# Patient Record
Sex: Male | Born: 1955
Health system: Southern US, Community
[De-identification: ages and names within clinical notes are randomized; demographics above are authoritative.]

## PROBLEM LIST (undated history)

## (undated) DIAGNOSIS — M25559 Pain in unspecified hip: Secondary | ICD-10-CM

## (undated) DIAGNOSIS — Z87442 Personal history of urinary calculi: Secondary | ICD-10-CM

## (undated) DIAGNOSIS — E785 Hyperlipidemia, unspecified: Secondary | ICD-10-CM

## (undated) DIAGNOSIS — J449 Chronic obstructive pulmonary disease, unspecified: Secondary | ICD-10-CM

## (undated) DIAGNOSIS — I10 Essential (primary) hypertension: Secondary | ICD-10-CM

## (undated) DIAGNOSIS — M1990 Unspecified osteoarthritis, unspecified site: Secondary | ICD-10-CM

## (undated) HISTORY — DX: Pain in unspecified hip: M25.559

## (undated) HISTORY — PX: CYSTOSCOPY: SUR368

## (undated) HISTORY — PX: LITHOTRIPSY: SUR834

## (undated) HISTORY — DX: Essential (primary) hypertension: I10

## (undated) HISTORY — DX: Hyperlipidemia, unspecified: E78.5

---
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
## 1999-03-25 ENCOUNTER — Emergency Department (HOSPITAL_COMMUNITY): Admission: EM | Admit: 1999-03-25 | Discharge: 1999-03-25 | Payer: Self-pay | Admitting: Emergency Medicine

## 1999-03-25 ENCOUNTER — Encounter: Payer: Self-pay | Admitting: Emergency Medicine

## 1999-03-29 ENCOUNTER — Ambulatory Visit (HOSPITAL_COMMUNITY): Admission: RE | Admit: 1999-03-29 | Discharge: 1999-03-29 | Payer: Self-pay | Admitting: Urology

## 1999-03-30 ENCOUNTER — Encounter: Payer: Self-pay | Admitting: Urology

## 1999-04-24 ENCOUNTER — Encounter: Payer: Self-pay | Admitting: Urology

## 1999-04-24 ENCOUNTER — Encounter: Admission: RE | Admit: 1999-04-24 | Discharge: 1999-04-24 | Payer: Self-pay | Admitting: Urology

## 1999-07-10 ENCOUNTER — Encounter: Payer: Self-pay | Admitting: Emergency Medicine

## 1999-07-10 ENCOUNTER — Emergency Department (HOSPITAL_COMMUNITY): Admission: EM | Admit: 1999-07-10 | Discharge: 1999-07-10 | Payer: Self-pay | Admitting: Emergency Medicine

## 1999-09-12 ENCOUNTER — Encounter: Admission: RE | Admit: 1999-09-12 | Discharge: 1999-09-12 | Payer: Self-pay | Admitting: Urology

## 1999-09-12 ENCOUNTER — Encounter: Payer: Self-pay | Admitting: Urology

## 1999-09-14 ENCOUNTER — Encounter: Payer: Self-pay | Admitting: Urology

## 1999-09-14 ENCOUNTER — Encounter: Admission: RE | Admit: 1999-09-14 | Discharge: 1999-09-14 | Payer: Self-pay | Admitting: Urology

## 1999-10-08 ENCOUNTER — Encounter: Admission: RE | Admit: 1999-10-08 | Discharge: 1999-10-08 | Payer: Self-pay | Admitting: Urology

## 1999-10-08 ENCOUNTER — Encounter: Payer: Self-pay | Admitting: Urology

## 1999-10-22 ENCOUNTER — Encounter: Payer: Self-pay | Admitting: Urology

## 1999-10-26 ENCOUNTER — Ambulatory Visit (HOSPITAL_COMMUNITY): Admission: RE | Admit: 1999-10-26 | Discharge: 1999-10-26 | Payer: Self-pay | Admitting: Urology

## 2002-08-23 ENCOUNTER — Encounter: Payer: Self-pay | Admitting: Urology

## 2002-08-23 ENCOUNTER — Ambulatory Visit (HOSPITAL_BASED_OUTPATIENT_CLINIC_OR_DEPARTMENT_OTHER): Admission: RE | Admit: 2002-08-23 | Discharge: 2002-08-23 | Payer: Self-pay | Admitting: Urology

## 2004-12-24 ENCOUNTER — Ambulatory Visit (HOSPITAL_COMMUNITY): Admission: RE | Admit: 2004-12-24 | Discharge: 2004-12-24 | Payer: Self-pay | Admitting: Urology

## 2017-03-18 DIAGNOSIS — C801 Malignant (primary) neoplasm, unspecified: Secondary | ICD-10-CM

## 2017-03-18 HISTORY — DX: Malignant (primary) neoplasm, unspecified: C80.1

## 2017-03-18 HISTORY — PX: HEMORRHOID BANDING: SHX5850

## 2018-03-18 DIAGNOSIS — J449 Chronic obstructive pulmonary disease, unspecified: Secondary | ICD-10-CM

## 2018-03-18 HISTORY — PX: LYMPHADENECTOMY: SHX5960

## 2018-03-18 HISTORY — DX: Chronic obstructive pulmonary disease, unspecified: J44.9

## 2018-11-03 ENCOUNTER — Other Ambulatory Visit: Payer: Self-pay | Admitting: Internal Medicine

## 2018-11-03 DIAGNOSIS — Z720 Tobacco use: Secondary | ICD-10-CM

## 2018-11-10 ENCOUNTER — Other Ambulatory Visit: Payer: Self-pay

## 2018-11-10 ENCOUNTER — Ambulatory Visit
Admission: RE | Admit: 2018-11-10 | Discharge: 2018-11-10 | Disposition: A | Discharge status: Home / Self Care | Payer: Medicare Other | Source: Ambulatory Visit | Attending: Internal Medicine | Admitting: Internal Medicine

## 2018-11-10 ENCOUNTER — Inpatient Hospital Stay: Admission: RE | Admit: 2018-11-10 | Payer: Self-pay | Source: Ambulatory Visit

## 2018-11-10 DIAGNOSIS — Z720 Tobacco use: Secondary | ICD-10-CM

## 2018-12-13 NOTE — Progress Notes (Signed)
Patient referred by Crist Infante, MD for coronary and aortic atherosclerosis  Subjective:   Nathan Porter, male    DOB: 19-Sep-1955, 63 y.o.   MRN: [id number]    Chief Complaint  Patient presents with  . Coronary Artery Disease  . New Patient (Initial Visit)     HPI  63 y.o. Caucasian male with hypertension, hyperlipidemia, tobacco abuse, COPD, referred for management of aortic and coronary atherosclerosis-noted on lung cancer screening CT chest.   Patient is an [place].  His physical activity is limited due to his pain.  However, he is able to mow his lawn of about 0.25 acres using push mower every day without any chest pain or shortness of breath.  He was recently diagnosed to have COPD with only symptoms being cough.  He started on Chantix and is trying to quit smoking.  Blood pressure is elevated at baseline.  He is currently not on any antihypertensive agents.  He was recently started on rosuvastatin 10 mg daily by Dr. Joylene Draft.  Past Medical History:  Diagnosis Date  . COPD (chronic obstructive pulmonary disease) ([place])   . Hip discomfort   . Hyperlipidemia   . Hypertension      Past Surgical History:  Procedure Laterality Date  . LYMPHADENECTOMY  03/2018     Social History   Socioeconomic History  . Marital status: Single    Spouse name: Not on file  . Number of children: Not on file  . Years of education: Not on file  . Highest education level: Not on file  Occupational History  . Not on file  Social Needs  . Financial resource strain: Not on file  . Food insecurity    Worry: Not on file    Inability: Not on file  . Transportation needs    Medical: Not on file    Non-medical: Not on file  Tobacco Use  . Smoking status: Not on file  Substance and Sexual Activity  . Alcohol use: Not on file  . Drug use: Not on file  . Sexual activity: Not on file  Lifestyle  . Physical activity    Days per week: Not on file    Minutes per session: Not  on file  . Stress: Not on file  Relationships  . Social Herbalist on phone: Not on file    Gets together: Not on file    Attends religious service: Not on file    Active member of club or organization: Not on file    Attends meetings of clubs or organizations: Not on file    Relationship status: Not on file  . Intimate partner violence    Fear of current or ex partner: Not on file    Emotionally abused: Not on file    Physically abused: Not on file    Forced sexual activity: Not on file  Other Topics Concern  . Not on file  Social History Narrative  . Not on file     Family History  Problem Relation Age of Onset  . Lung disease Mother        Tumor  . CAD Father        S/pCABG     Current Outpatient Medications on File Prior to Visit  Medication Sig Dispense Refill  . acyclovir (ZOVIRAX) 400 MG tablet Take 1 tablet by mouth as needed.    . CHANTIX STARTING MONTH PAK 0.5 MG X 11 & 1 MG X  42 tablet Take 2 tablets by mouth daily.    Marland Kitchen HYDROcodone-acetaminophen (NORCO/VICODIN) 5-325 MG tablet Take 1 tablet by mouth daily as needed.    . meloxicam (MOBIC) 15 MG tablet Take 1 tablet by mouth daily.    . promethazine (PHENERGAN) 25 MG tablet Take 1 tablet by mouth daily as needed.    . rosuvastatin (CRESTOR) 10 MG tablet Take 10 mg by mouth daily.    . tamsulosin (FLOMAX) 0.4 MG CAPS capsule Take 2 capsules by mouth at bedtime.    . traMADol (ULTRAM) 50 MG tablet Take 1 mg by mouth daily as needed.     No current facility-administered medications on file prior to visit.     Cardiovascular studies:  EKG 12/14/2018: Sinus rhythm 79 bpm.  Borderline left atrial enlargement. Early R wave transition. Nonspecific ST depression anteroseptal leads.  CT chest 11/10/2018: 1. Lung-RADS 2S, benign appearance or behavior. Continue annual screening with low-dose chest CT without contrast in 12 months. 2. The "S" modifier above refers to potentially clinically significant  non lung cancer related findings. Specifically, there is aortic atherosclerosis, in addition to left main and 3 vessel coronary artery disease. Please note that although the presence of coronary artery calcium documents the presence of coronary artery disease, the severity of this disease and any potential stenosis cannot be assessed on this non-gated CT examination. Assessment for potential risk factor modification, dietary therapy or pharmacologic therapy may be warranted, if clinically indicated. 3. Mild diffuse bronchial wall thickening with mild to moderate centrilobular and mild paraseptal emphysema; imaging findings suggestive of underlying COPD.  Aortic Atherosclerosis (ICD10-I70.0) and Emphysema (ICD10-J43.9).   Recent labs: 10/01/2018: Glucose 93. BUN/Cr 19/1.0. eGFR 75. Na/K 141/4.8 H/H 16/49. MCV 92. Plateelts 274. Chol 190, TG 99, HDL 39, LDL 131. LDL-P 1348 nmol/L (<1000) HDL-P 26.6 nmol/L (>30.5)    Review of Systems  Constitution: Negative for decreased appetite, malaise/fatigue, weight gain and weight loss.  HENT: Negative for congestion.   Eyes: Negative for visual disturbance.  Cardiovascular: Negative for chest pain, dyspnea on exertion, leg swelling, palpitations and syncope.  Respiratory: Negative for cough.   Endocrine: Negative for cold intolerance.  Hematologic/Lymphatic: Does not bruise/bleed easily.  Skin: Negative for itching and rash.  Musculoskeletal: Negative for myalgias.  Gastrointestinal: Negative for abdominal pain, nausea and vomiting.  Genitourinary: Negative for dysuria.  Neurological: Negative for dizziness and weakness.  Psychiatric/Behavioral: The patient is not nervous/anxious.   All other systems reviewed and are negative.        Vitals:   12/14/18 0859  BP: (!) 145/71  Pulse: 84  Temp: (!) 97.3 F (36.3 C)  SpO2: 95%     There is no height or weight on file to calculate BMI. Filed Weights   12/14/18 0859  Weight:  193 lb 1.6 oz (87.6 kg)     Objective:   Physical Exam  Constitutional: He is oriented to person, place, and time. He appears well-developed and well-nourished. No distress.  HENT:  Head: Normocephalic and atraumatic.  Eyes: Pupils are equal, round, and reactive to light. Conjunctivae are normal.  Neck: No JVD present.  Cardiovascular: Normal rate, regular rhythm and intact distal pulses.  No murmur heard. Pulmonary/Chest: Effort normal and breath sounds normal. He has no wheezes. He has no rales.  Abdominal: Soft. Bowel sounds are normal. There is no rebound.  Musculoskeletal:        General: No edema.  Lymphadenopathy:    He has no cervical adenopathy.  Neurological:  He is alert and oriented to person, place, and time. No cranial nerve deficit.  Skin: Skin is warm and dry.  Psychiatric: He has a normal mood and affect.  Nursing note and vitals reviewed.         Assessment & Recommendations:   63 y.o. Caucasian male with tobacco abuse, hyperlipidemia, referred for management of aortic and coronary atherosclerosis-noted on lung cancer screening CT chest.   Coronary and aortic atherosclerosis: Noted on lung cancer screening CT chest. Patient does not have any angina or angina equivalent symptoms at this time. Recommend aggressive risk factor modification. Recommend Aspirin 81 mg daily and high intensity statin. Continue rosuvastatin 40 mg for now. Will repeat lipid panel in 3 months. Started amlodipine 5 mg daily for blood hypertension.  Tobacco cessation counseling (CPT [phone]):  - Currently smoking 1 packs/day   - Patient was informed of the dangers of tobacco abuse including stroke, cancer, and MI, as well as benefits of tobacco cessation. - Patient is willing to quit at this time. - Approximately 5 mins were spent counseling patient cessation techniques. We discussed various methods to help quit smoking, including deciding on a date to quit, joining a support group,  pharmacological agents- nicotine gum/patch/lozenges, chantix. Patient is currently using Chantix. - I will reassess his progress at the next follow-up visit  AAA screening: >65 yr smoker. Will obtain US.   Hypertension: As above.  Recommend discussing flu, Pneumovax, and herpes Zoster vaccine with the patient.    Thank you for referring the patient to [place]. Please feel free to contact with any questions.  Nigel Mormon, MD [facility] Pager: [phone] Office: [phone] If no answer Cell [phone]

## 2018-12-14 ENCOUNTER — Ambulatory Visit (INDEPENDENT_AMBULATORY_CARE_PROVIDER_SITE_OTHER): Payer: Medicare Other | Admitting: Cardiology

## 2018-12-14 ENCOUNTER — Encounter: Payer: Self-pay | Admitting: Cardiology

## 2018-12-14 VITALS — BP 145/71 | HR 84 | Temp 97.3°F | Ht 68.0 in | Wt 193.1 lb

## 2018-12-14 DIAGNOSIS — I700 Atherosclerosis of aorta: Secondary | ICD-10-CM

## 2018-12-14 DIAGNOSIS — I10 Essential (primary) hypertension: Secondary | ICD-10-CM | POA: Diagnosis not present

## 2018-12-14 DIAGNOSIS — I2510 Atherosclerotic heart disease of native coronary artery without angina pectoris: Secondary | ICD-10-CM | POA: Insufficient documentation

## 2018-12-14 DIAGNOSIS — F17210 Nicotine dependence, cigarettes, uncomplicated: Secondary | ICD-10-CM | POA: Diagnosis not present

## 2018-12-14 DIAGNOSIS — E782 Mixed hyperlipidemia: Secondary | ICD-10-CM | POA: Diagnosis not present

## 2018-12-14 DIAGNOSIS — Z720 Tobacco use: Secondary | ICD-10-CM

## 2018-12-14 MED ORDER — AMLODIPINE BESYLATE 5 MG PO TABS: 5.0000 mg | ORAL_TABLET | Freq: Every day | ORAL | 3 refills | Status: DC

## 2018-12-14 MED ORDER — ASPIRIN EC 81 MG PO TBEC: 81.0000 mg | DELAYED_RELEASE_TABLET | Freq: Every day | ORAL | 3 refills | Status: DC

## 2019-01-08 ENCOUNTER — Other Ambulatory Visit: Payer: Self-pay

## 2019-01-08 ENCOUNTER — Ambulatory Visit (INDEPENDENT_AMBULATORY_CARE_PROVIDER_SITE_OTHER): Payer: Medicare Other

## 2019-01-08 DIAGNOSIS — Z720 Tobacco use: Secondary | ICD-10-CM | POA: Diagnosis not present

## 2019-01-08 DIAGNOSIS — I771 Stricture of artery: Secondary | ICD-10-CM

## 2019-01-08 DIAGNOSIS — I700 Atherosclerosis of aorta: Secondary | ICD-10-CM

## 2019-01-11 NOTE — Progress Notes (Signed)
Pt aware.

## 2019-03-15 ENCOUNTER — Ambulatory Visit: Payer: 59 | Admitting: Cardiology

## 2019-03-15 ENCOUNTER — Ambulatory Visit (INDEPENDENT_AMBULATORY_CARE_PROVIDER_SITE_OTHER): Payer: Medicare Other | Admitting: Cardiology

## 2019-03-15 ENCOUNTER — Other Ambulatory Visit: Payer: Self-pay

## 2019-03-15 ENCOUNTER — Encounter: Payer: Self-pay | Admitting: Cardiology

## 2019-03-15 VITALS — BP 133/71 | HR 76 | Ht 69.0 in | Wt 200.0 lb

## 2019-03-15 DIAGNOSIS — I10 Essential (primary) hypertension: Secondary | ICD-10-CM | POA: Diagnosis not present

## 2019-03-15 DIAGNOSIS — I2510 Atherosclerotic heart disease of native coronary artery without angina pectoris: Secondary | ICD-10-CM

## 2019-03-15 DIAGNOSIS — E782 Mixed hyperlipidemia: Secondary | ICD-10-CM | POA: Diagnosis not present

## 2019-03-15 DIAGNOSIS — I700 Atherosclerosis of aorta: Secondary | ICD-10-CM | POA: Diagnosis not present

## 2019-03-15 NOTE — Progress Notes (Signed)
Patient referred by Crist Infante, MD for coronary and aortic atherosclerosis  Subjective:   Nathan Porter, male    DOB: [birth year], 63 y.o.   MRN: [id number]    Chief Complaint  Patient presents with  . Aortic atherosclerosis     HPI  63 y.o. Caucasian male with hypertension, hyperlipidemia, aortic and coronary atherosclerosis without angina.  He is currently not taking Aspirin. His primary complaint is hip pain, for which he is taking meloxicam. Blood pressure is well controlled on amlodipine. He has not checked his lipid panel.   Initial consult HPI 11/2018: Patient is an [place].  His physical activity is limited due to his pain.  However, he is able to mow his lawn of about 0.25 acres using push mower every day without any chest pain or shortness of breath.  He was recently diagnosed to have COPD with only symptoms being cough.  He started on Chantix and is trying to quit smoking.  Blood pressure is elevated at baseline.  He is currently not on any antihypertensive agents.  He was recently started on rosuvastatin 10 mg daily by Dr. Joylene Draft.  Past Medical History:  Diagnosis Date  . COPD (chronic obstructive pulmonary disease) ([place])   . Hip discomfort   . Hyperlipidemia   . Hypertension      Past Surgical History:  Procedure Laterality Date  . LYMPHADENECTOMY  03/2018     Social History   Socioeconomic History  . Marital status: Married    Spouse name: Not on file  . Number of children: 2  . Years of education: Not on file  . Highest education level: Not on file  Occupational History  . Not on file  Tobacco Use  . Smoking status: Current Every Day Smoker    Packs/day: 1.00  . Smokeless tobacco: Never Used  Substance and Sexual Activity  . Alcohol use: Not Currently  . Drug use: Not on file  . Sexual activity: Not on file  Other Topics Concern  . Not on file  Social History Narrative  . Not on file   Social Determinants of Health    Financial Resource Strain:   . Difficulty of Paying Living Expenses: Not on file  Food Insecurity:   . Worried About Charity fundraiser in the Last Year: Not on file  . Ran Out of Food in the Last Year: Not on file  Transportation Needs:   . Lack of Transportation (Medical): Not on file  . Lack of Transportation (Non-Medical): Not on file  Physical Activity:   . Days of Exercise per Week: Not on file  . Minutes of Exercise per Session: Not on file  Stress:   . Feeling of Stress : Not on file  Social Connections:   . Frequency of Communication with Friends and Family: Not on file  . Frequency of Social Gatherings with Friends and Family: Not on file  . Attends Religious Services: Not on file  . Active Member of Clubs or Organizations: Not on file  . Attends Archivist Meetings: Not on file  . Marital Status: Not on file  Intimate Partner Violence:   . Fear of Current or Ex-Partner: Not on file  . Emotionally Abused: Not on file  . Physically Abused: Not on file  . Sexually Abused: Not on file     Family History  Problem Relation Age of Onset  . Lung disease Mother        Tumor  .  CAD Father        S/pCABG     Current Outpatient Medications on File Prior to Visit  Medication Sig Dispense Refill  . acyclovir (ZOVIRAX) 400 MG tablet Take 1 tablet by mouth as needed.    Marland Kitchen amLODipine (NORVASC) 5 MG tablet Take 1 tablet (5 mg total) by mouth daily. 90 tablet 3  . meloxicam (MOBIC) 15 MG tablet Take 1 tablet by mouth daily.    . promethazine (PHENERGAN) 25 MG tablet Take 1 tablet by mouth daily as needed.    . rosuvastatin (CRESTOR) 10 MG tablet Take 10 mg by mouth daily.    . tamsulosin (FLOMAX) 0.4 MG CAPS capsule Take 2 capsules by mouth at bedtime.    . traMADol (ULTRAM) 50 MG tablet Take 1 mg by mouth daily as needed.     No current facility-administered medications on file prior to visit.    Cardiovascular studies:  Abdominal Aortic Duplex   01/08/2019: No AAA observed.  The maximum aorta (sac) diameter is 2.24 cm (mid). Diffuse plaque observed in the mid aorta.  Mild increase in left internal iliac artery velocity and suggests <50% stenosis, with normal triphasic wave pattern.   EKG 12/14/2018: Sinus rhythm 79 bpm.  Borderline left atrial enlargement. Early R wave transition. Nonspecific ST depression anteroseptal leads.  CT chest 11/10/2018: 1. Lung-RADS 2S, benign appearance or behavior. Continue annual screening with low-dose chest CT without contrast in 12 months. 2. The "S" modifier above refers to potentially clinically significant non lung cancer related findings. Specifically, there is aortic atherosclerosis, in addition to left main and 3 vessel coronary artery disease. Please note that although the presence of coronary artery calcium documents the presence of coronary artery disease, the severity of this disease and any potential stenosis cannot be assessed on this non-gated CT examination. Assessment for potential risk factor modification, dietary therapy or pharmacologic therapy may be warranted, if clinically indicated. 3. Mild diffuse bronchial wall thickening with mild to moderate centrilobular and mild paraseptal emphysema; imaging findings suggestive of underlying COPD.  Aortic Atherosclerosis (ICD10-I70.0) and Emphysema (ICD10-J43.9).   Recent labs: 10/01/2018: Glucose 93. BUN/Cr 19/1.0. eGFR 75. Na/K 141/4.8 H/H 16/49. MCV 92. Plateelts 274. Chol 190, TG 99, HDL 39, LDL 131. LDL-P 1348 nmol/L (<1000) HDL-P 26.6 nmol/L (>30.5)    Review of Systems  Constitution: Negative for decreased appetite, malaise/fatigue, weight gain and weight loss.  HENT: Negative for congestion.   Eyes: Negative for visual disturbance.  Cardiovascular: Negative for chest pain, dyspnea on exertion, leg swelling, palpitations and syncope.  Respiratory: Negative for cough.   Endocrine: Negative for cold intolerance.   Hematologic/Lymphatic: Does not bruise/bleed easily.  Skin: Negative for itching and rash.  Musculoskeletal: Negative for myalgias.  Gastrointestinal: Negative for abdominal pain, nausea and vomiting.  Genitourinary: Negative for dysuria.  Neurological: Negative for dizziness and weakness.  Psychiatric/Behavioral: The patient is not nervous/anxious.   All other systems reviewed and are negative.        Vitals:   03/15/19 1527  BP: 133/71  Pulse: 76  SpO2: 94%     Body mass index is 29.53 kg/m. Filed Weights   03/15/19 1527  Weight: 200 lb (90.7 kg)     Objective:   Physical Exam  Constitutional: He is oriented to person, place, and time. He appears well-developed and well-nourished. No distress.  HENT:  Head: Normocephalic and atraumatic.  Eyes: Pupils are equal, round, and reactive to light. Conjunctivae are normal.  Neck: No JVD present.  Cardiovascular: Normal rate, regular rhythm and intact distal pulses.  No murmur heard. Pulmonary/Chest: Effort normal and breath sounds normal. He has no wheezes. He has no rales.  Abdominal: Soft. Bowel sounds are normal. There is no rebound.  Musculoskeletal:        General: No edema.  Lymphadenopathy:    He has no cervical adenopathy.  Neurological: He is alert and oriented to person, place, and time. No cranial nerve deficit.  Skin: Skin is warm and dry.  Psychiatric: He has a normal mood and affect.  Nursing note and vitals reviewed.         Assessment & Recommendations:   63 y.o. Caucasian male with hypertension, hyperlipidemia, aortic and coronary atherosclerosis without angina.  Coronary and aortic atherosclerosis: Noted on lung cancer screening CT chest. Patient does not have any angina or angina equivalent symptoms at this time. Recommend aggressive risk factor modification. Continue rosuvastatin 40 mg daily. Lipid panel pending. Will obtain now. Given ongoing use of meloxicam, it is probably best to skip  Aspirin at this time, as risks outweigh benefits.   Hypertension: Controlled.   F/u in 6 months  Lamoyne Palencia Esther Hardy, MD [facility] [place] Pager: [phone] Office: [phone] If no answer Cell [phone]

## 2019-04-09 ENCOUNTER — Ambulatory Visit: Payer: Medicare Other | Attending: Internal Medicine

## 2019-04-09 DIAGNOSIS — Z23 Encounter for immunization: Secondary | ICD-10-CM

## 2019-04-09 NOTE — Progress Notes (Signed)
   Covid-19 Vaccination Clinic  Name:  Nathan Porter    MRN: [id number] DOB: July 07, 1955  04/09/2019  [name] was observed post Covid-19 immunization for 15 minutes without incidence. He was provided with Vaccine Information Sheet and instruction to access the V-Safe system.   [name] was instructed to call 911 with any severe reactions post vaccine: Marland Kitchen Difficulty breathing  . Swelling of your face and throat  . A fast heartbeat  . A bad rash all over your body  . Dizziness and weakness    Immunizations Administered    Name Date Dose VIS Date Route   Pfizer COVID-19 Vaccine 04/09/2019  8:20 AM 0.3 mL 02/26/2019 Intramuscular   Manufacturer: [place]   Lot: [id number]   [place]: [id number]

## 2019-04-29 ENCOUNTER — Ambulatory Visit: Payer: Medicare Other | Attending: Internal Medicine

## 2019-04-29 DIAGNOSIS — Z23 Encounter for immunization: Secondary | ICD-10-CM

## 2019-04-29 NOTE — Progress Notes (Signed)
   Covid-19 Vaccination Clinic  Name:  Nathan Porter    MRN: [id number] DOB: March 17, 1956  04/29/2019  Nathan Porter was observed post Covid-19 immunization for 15 minutes without incidence. He was provided with Vaccine Information Sheet and instruction to access the V-Safe system.   Nathan Porter was instructed to call 911 with any severe reactions post vaccine: Marland Kitchen Difficulty breathing  . Swelling of your face and throat  . A fast heartbeat  . A bad rash all over your body  . Dizziness and weakness    Immunizations Administered    Name Date Dose VIS Date Route   Pfizer COVID-19 Vaccine 04/29/2019  3:58 PM 0.3 mL 02/26/2019 Intramuscular   Manufacturer: [place]   Lot: [id number]   [place]: [id number]

## 2019-06-28 ENCOUNTER — Encounter: Payer: Self-pay | Admitting: Cardiology

## 2019-07-16 ENCOUNTER — Encounter: Payer: Self-pay | Admitting: Emergency Medicine

## 2019-07-16 ENCOUNTER — Other Ambulatory Visit: Payer: Self-pay

## 2019-07-16 ENCOUNTER — Ambulatory Visit (INDEPENDENT_AMBULATORY_CARE_PROVIDER_SITE_OTHER): Payer: Medicare Other | Admitting: Emergency Medicine

## 2019-07-16 DIAGNOSIS — J449 Chronic obstructive pulmonary disease, unspecified: Secondary | ICD-10-CM

## 2019-07-16 DIAGNOSIS — Z720 Tobacco use: Secondary | ICD-10-CM | POA: Diagnosis not present

## 2019-07-16 NOTE — Addendum Note (Signed)
Addended by: Vanessa Barbara on: 07/16/2019 02:25 PM   Modules accepted: Orders

## 2019-07-16 NOTE — Assessment & Plan Note (Signed)
Needs repeat low-dose CT scan of the chest in August 2021 for RADS 2 study last year.  Discussed cessation with him.  He is not ready to set a quit date even though he is currently maintained on Wellbutrin.  I have encouraged him to try to cut down to 10 cigarettes daily.  We will continue to try to wean until we get to the point where he feels comfortable trying to set a quit date.  At that time we will discuss strategies for successful cessation.

## 2019-07-16 NOTE — Assessment & Plan Note (Signed)
Emphysema seen on his CT scan of the chest and he does have exertional symptoms.  Severity not yet quantified by PFT but based on clinical status likely gold B disease.  No clear indication at this time for bronchodilators.  He does need pulmonary function testing.  He would like to defer until "Covid calms down".  I do not think there is an urgent need to check his PFT as long as we do it soon.  I do not think he needs this for me to determine his risk for upcoming hip surgery.  He is at moderate increased risk due to his COPD and active tobacco use.  I did talk to him about cessation.  Based on your CT scan of the chest and clinical symptoms, history of tobacco, agree that you do have COPD which places you at moderate risk for surgery or general anesthesia.  This does not mean that you cannot proceed with surgery.  We will confirm this information and recommendation for Dr. Alvester Morin office. We should perform pulmonary function testing when possible to quantify your degree of COPD and decide based on this and symptoms whether he might benefit from taking inhaled medications in the future.  We can discuss the timing of PFT when we follow-up The most important thing for you to do right now is to try to decrease her smoking.  Congratulations on cutting down.  We will set a goal of getting down to 10 cigarettes daily before your surgery and by our next office visit. Plan for repeat low-dose CT scan of the chest for lung cancer screening in August 2021 Follow with Dr Lamonte Sakai in 6 months or sooner if you have any problems

## 2019-07-16 NOTE — Patient Instructions (Signed)
Based on your CT scan of the chest and clinical symptoms, history of tobacco, agree that you do have COPD which places you at moderate risk for surgery or general anesthesia.  This does not mean that you cannot proceed with surgery.  We will confirm this information and recommendation for Dr. Alvester Morin office. We should perform pulmonary function testing when possible to quantify your degree of COPD and decide based on this and symptoms whether he might benefit from taking inhaled medications in the future.  We can discuss the timing of PFT when we follow-up The most important thing for you to do right now is to try to decrease her smoking.  Congratulations on cutting down.  We will set a goal of getting down to 10 cigarettes daily before your surgery and by our next office visit. Plan for repeat low-dose CT scan of the chest for lung cancer screening in August 2021 Follow with Dr Lamonte Sakai in 6 months or sooner if you have any problems

## 2019-07-16 NOTE — Progress Notes (Signed)
Subjective:    Patient ID: Nathan Porter, male    DOB: 08-29-1955, 64 y.o.   MRN: [id number]  HPI 64 year old smoker (45 pack years, smokes 1 pack daily), with hypertension, hyperlipidemia, CAD based on coronary calcification on LDCT performed for lung cancer screening 10/2018.  That was a RADS 2 study, reassuring.  Referred today for pulmonary evaluation and restratification in preparation for right total hip replacement by Dr. French Ana.   He was told that he had COPD about 5 months ago, based on emphysematous changes on LDCT 10/2018. 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 Activity limited some by his R hip pain, some deconditioning. He has a cough, bothers him at night, does not wake him. Has heard wheeze before, usually when he needs to cough. He is able walk indefinitely without dyspnea, some SOB stairs. He can play golf but was SOB with the hills. On no BD. He has tried chantix, ineffective. Now on wellbutrin and has cut down to 15 cig daily.    Seasonal allergies, uses nasacort prn  Review of Systems  Constitutional: Negative for activity change, appetite change, chills, diaphoresis, fatigue, fever and unexpected weight change.  HENT: Negative for congestion, dental problem, nosebleeds, postnasal drip, rhinorrhea, sinus pressure, sneezing, trouble swallowing and voice change.   Eyes: Negative for itching and visual disturbance.  Respiratory: Positive for cough and shortness of breath. Negative for choking, chest tightness, wheezing and stridor.   Cardiovascular: Negative for chest pain, palpitations and leg swelling.  Gastrointestinal: Negative for abdominal pain.  Musculoskeletal: Positive for gait problem and joint swelling. Negative for myalgias.  Skin: Negative for rash.  Neurological: Negative for syncope, light-headedness and headaches.  Psychiatric/Behavioral: Negative for sleep disturbance.    Past Medical History:  Diagnosis Date  . COPD (chronic  obstructive pulmonary disease) ([place])   . Hip discomfort   . Hyperlipidemia   . Hypertension      Family History  Problem Relation Age of Onset  . Lung disease Mother        Tumor  . CAD Father        S/pCABG     Social History   Socioeconomic History  . Marital status: Married    Spouse name: Not on file  . Number of children: 2  . Years of education: Not on file  . Highest education level: Not on file  Occupational History  . Not on file  Tobacco Use  . Smoking status: Current Every Day Smoker    Packs/day: 1.00    Years: 45.00    Pack years: 45.00  . Smokeless tobacco: Never Used  Substance and Sexual Activity  . Alcohol use: Not Currently  . Drug use: Not on file  . Sexual activity: Not on file  Other Topics Concern  . Not on file  Social History Narrative  . Not on file   Social Determinants of Health   Financial Resource Strain:   . Difficulty of Paying Living Expenses:   Food Insecurity:   . Worried About Charity fundraiser in the Last Year:   . Arboriculturist in the Last Year:   Transportation Needs:   . Film/video editor (Medical):   Marland Kitchen Lack of Transportation (Non-Medical):   Physical Activity:   . Days of Exercise per Week:   . Minutes of Exercise per Session:   Stress:   . Feeling of Stress :   Social Connections:   .  Frequency of Communication with Friends and Family:   . Frequency of Social Gatherings with Friends and Family:   . Attends Religious Services:   . Active Member of Clubs or Organizations:   . Attends Archivist Meetings:   Marland Kitchen Marital Status:   Intimate Partner Violence:   . Fear of Current or Ex-Partner:   . Emotionally Abused:   Marland Kitchen Physically Abused:   . Sexually Abused:      Not on File   Outpatient Medications Prior to Visit  Medication Sig Dispense Refill  . meloxicam (MOBIC) 15 MG tablet Take 1 tablet by mouth daily.    . methylcellulose oral powder Take 1 packet by mouth daily. OTC fibercon daily      . OVER THE COUNTER MEDICATION Place 1 puff into alternate nostrils. nasocort OTC    . rosuvastatin (CRESTOR) 10 MG tablet Take 10 mg by mouth daily.    . tamsulosin (FLOMAX) 0.4 MG CAPS capsule Take 2 capsules by mouth at bedtime.    Marland Kitchen amLODipine (NORVASC) 5 MG tablet Take 1 tablet (5 mg total) by mouth daily. 90 tablet 3  . promethazine (PHENERGAN) 25 MG tablet Take 1 tablet by mouth daily as needed.    . traMADol (ULTRAM) 50 MG tablet Take 1 mg by mouth daily as needed.    Marland Kitchen acyclovir (ZOVIRAX) 400 MG tablet Take 1 tablet by mouth as needed.     No facility-administered medications prior to visit.        Objective:   Physical Exam  Vitals:   07/16/19 1338  BP: 120/80  Pulse: 69  Temp: 97.9 F (36.6 C)  TempSrc: Temporal  SpO2: 95%  Weight: 199 lb 3.2 oz (90.4 kg)  Height: 5\' 8"  (1.727 m)   Gen: Pleasant, overwt man, in no distress,  normal affect  ENT: No lesions,  mouth clear,  oropharynx clear, no postnasal drip  Neck: No JVD, no stridor  Lungs: No use of accessory muscles, no crackles or wheezing on normal respiration, he does wheeze on forced expiration  Cardiovascular: RRR, heart sounds normal, no murmur or gallops, no peripheral edema  Musculoskeletal: No deformities, no cyanosis or clubbing  Neuro: alert, awake, non focal  Skin: Warm, no lesions or rash     Assessment & Plan:  COPD (chronic obstructive pulmonary disease) (HCC) Emphysema seen on his CT scan of the chest and he does have exertional symptoms.  Severity not yet quantified by PFT but based on clinical status likely gold B disease.  No clear indication at this time for bronchodilators.  He does need pulmonary function testing.  He would like to defer until "Covid calms down".  I do not think there is an urgent need to check his PFT as long as we do it soon.  I do not think he needs this for me to determine his risk for upcoming hip surgery.  He is at moderate increased risk due to his COPD and  active tobacco use.  I did talk to him about cessation.  Based on your CT scan of the chest and clinical symptoms, history of tobacco, agree that you do have COPD which places you at moderate risk for surgery or general anesthesia.  This does not mean that you cannot proceed with surgery.  We will confirm this information and recommendation for Dr. Alvester Morin office. We should perform pulmonary function testing when possible to quantify your degree of COPD and decide based on this and symptoms whether he might  benefit from taking inhaled medications in the future.  We can discuss the timing of PFT when we follow-up The most important thing for you to do right now is to try to decrease her smoking.  Congratulations on cutting down.  We will set a goal of getting down to 10 cigarettes daily before your surgery and by our next office visit. Plan for repeat low-dose CT scan of the chest for lung cancer screening in August 2021 Follow with Dr Lamonte Sakai in 6 months or sooner if you have any problems  Tobacco abuse Needs repeat low-dose CT scan of the chest in August 2021 for RADS 2 study last year.  Discussed cessation with him.  He is not ready to set a quit date even though he is currently maintained on Wellbutrin.  I have encouraged him to try to cut down to 10 cigarettes daily.  We will continue to try to wean until we get to the point where he feels comfortable trying to set a quit date.  At that time we will discuss strategies for successful cessation.  Baltazar Apo, MD, PhD 07/16/2019, 2:04 PM [facility] [phone] or if no answer [phone]

## 2019-07-29 ENCOUNTER — Ambulatory Visit: Payer: Self-pay | Admitting: Physician Assistant

## 2019-07-29 NOTE — H&P (View-Only) (Signed)
TOTAL HIP ADMISSION H&P  Patient is admitted for right total hip arthroplasty.  Subjective:  Chief Complaint: right hip pain  HPI: Nathan Porter, 64 y.o. male, has a history of pain and functional disability in the right hip(s) due to arthritis and patient has failed non-surgical conservative treatments for greater than 12 weeks to include NSAID's and/or analgesics, corticosteriod injections and activity modification.  Onset of symptoms was gradual starting 5 years ago with gradually worsening course since that time.The patient noted no past surgery on the right hip(s).  Patient currently rates pain in the right hip at 8 out of 10 with activity. Patient has night pain, worsening of pain with activity and weight bearing, trendelenberg gait and pain that interfers with activities of daily living. Patient has evidence of periarticular osteophytes and joint space narrowing by imaging studies. This condition presents safety issues increasing the risk of falls. There is no current active infection.  Patient Active Problem List   Diagnosis Date Noted  . COPD (chronic obstructive pulmonary disease) ([place]) 07/16/2019  . Atherosclerosis of native coronary artery of native heart without angina pectoris 12/14/2018  . Aortic atherosclerosis ([place]) 12/14/2018  . Tobacco abuse 12/14/2018  . Mixed hyperlipidemia 12/14/2018  . Essential hypertension 12/14/2018   Past Medical History:  Diagnosis Date  . COPD (chronic obstructive pulmonary disease) ([place])   . Hip discomfort   . Hyperlipidemia   . Hypertension     Past Surgical History:  Procedure Laterality Date  . LYMPHADENECTOMY  03/2018    Current Outpatient Medications  Medication Sig Dispense Refill Last Dose  . amLODipine (NORVASC) 5 MG tablet Take 1 tablet (5 mg total) by mouth daily. (Patient taking differently: Take 5 mg by mouth at bedtime. ) 90 tablet 3   . buPROPion (WELLBUTRIN XL) 150 MG 24 hr tablet Take 150 mg by mouth daily.     .  polycarbophil (FIBERCON) 625 MG tablet Take 1,250 mg by mouth daily.     . promethazine (PHENERGAN) 25 MG tablet Take 25 mg by mouth 2 (two) times daily as needed (nausea associated with kidney stones.).      Marland Kitchen rosuvastatin (CRESTOR) 20 MG tablet Take 20 mg by mouth at bedtime.     . tamsulosin (FLOMAX) 0.4 MG CAPS capsule Take 2 capsules by mouth at bedtime.     . traMADol (ULTRAM) 50 MG tablet Take 50 mg by mouth 2 (two) times daily as needed (pain associated with kidney stones.).      Marland Kitchen triamcinolone (NASACORT ALLERGY 24HR) 55 MCG/ACT AERO nasal inhaler Place 2 sprays into the nose daily.      No current facility-administered medications for this visit.   No Known Allergies  Social History   Tobacco Use  . Smoking status: Current Every Day Smoker    Packs/day: 1.00    Years: 45.00    Pack years: 45.00  . Smokeless tobacco: Never Used  Substance Use Topics  . Alcohol use: Not Currently    Family History  Problem Relation Age of Onset  . Lung disease Mother        Tumor  . CAD Father        S/pCABG     Review of Systems  HENT: Positive for hearing loss and tinnitus.   Musculoskeletal: Positive for arthralgias.  All other systems reviewed and are negative.   Objective:  Physical Exam  Constitutional: He is oriented to person, place, and time. He appears well-developed and well-nourished. No distress.  HENT:  Head: Normocephalic and atraumatic.  Eyes: Pupils are equal, round, and reactive to light. Conjunctivae and EOM are normal.  Cardiovascular: Normal rate and intact distal pulses.  No murmur heard. Respiratory: Effort normal and breath sounds normal. No respiratory distress. He has no wheezes.  GI: Soft. Bowel sounds are normal. He exhibits no distension. There is no abdominal tenderness.  Musculoskeletal:     Cervical back: Normal range of motion and neck supple.     Right hip: Tenderness and bony tenderness present. Decreased range of motion.  Neurological: He is  alert and oriented to person, place, and time.  Skin: Skin is warm and dry. No rash noted. No erythema.  Psychiatric: He has a normal mood and affect. His behavior is normal.    Vital signs in last 24 hours: @VSRANGES @  Labs:   Estimated body mass index is 30.29 kg/m as calculated from the following:   Height as of 07/16/19: 5\' 8"  (1.727 m).   Weight as of 07/16/19: 90.4 kg.   Imaging Review Plain radiographs demonstrate severe degenerative joint disease of the right hip(s). The bone quality appears to be good for age and reported activity level.      Assessment/Plan:  End stage arthritis, right hip(s)  The patient history, physical examination, clinical judgement of the provider and imaging studies are consistent with end stage degenerative joint disease of the right hip(s) and total hip arthroplasty is deemed medically necessary. The treatment options including medical management, injection therapy, arthroscopy and arthroplasty were discussed at length. The risks and benefits of total hip arthroplasty were presented and reviewed. The risks due to aseptic loosening, infection, stiffness, dislocation/subluxation,  thromboembolic complications and other imponderables were discussed.  The patient acknowledged the explanation, agreed to proceed with the plan and consent was signed. Patient is being admitted for inpatient treatment for surgery, pain control, PT, OT, prophylactic antibiotics, VTE prophylaxis, progressive ambulation and ADL's and discharge planning.The patient is planning to be discharged home with home health services   Anticipated LOS equal to or greater than 2 midnights due to - Age 39 and older with one or more of the following:  - Obesity  - Expected need for hospital services (PT, OT, Nursing) required for safe  discharge  - Anticipated need for postoperative skilled nursing care or inpatient rehab  - Active co-morbidities: Respiratory Failure/COPD OR   -  Unanticipated findings during/Post Surgery: None  - Patient is a high risk of re-admission due to: None

## 2019-07-29 NOTE — H&P (Signed)
TOTAL HIP ADMISSION H&P  Patient is admitted for right total hip arthroplasty.  Subjective:  Chief Complaint: right hip pain  HPI: Nathan Porter, 64 y.o. male, has a history of pain and functional disability in the right hip(s) due to arthritis and patient has failed non-surgical conservative treatments for greater than 12 weeks to include NSAID's and/or analgesics, corticosteriod injections and activity modification.  Onset of symptoms was gradual starting 5 years ago with gradually worsening course since that time.The patient noted no past surgery on the right hip(s).  Patient currently rates pain in the right hip at 8 out of 10 with activity. Patient has night pain, worsening of pain with activity and weight bearing, trendelenberg gait and pain that interfers with activities of daily living. Patient has evidence of periarticular osteophytes and joint space narrowing by imaging studies. This condition presents safety issues increasing the risk of falls. There is no current active infection.  Patient Active Problem List   Diagnosis Date Noted  . COPD (chronic obstructive pulmonary disease) ([place]) 07/16/2019  . Atherosclerosis of native coronary artery of native heart without angina pectoris 12/14/2018  . Aortic atherosclerosis ([place]) 12/14/2018  . Tobacco abuse 12/14/2018  . Mixed hyperlipidemia 12/14/2018  . Essential hypertension 12/14/2018   Past Medical History:  Diagnosis Date  . COPD (chronic obstructive pulmonary disease) ([place])   . Hip discomfort   . Hyperlipidemia   . Hypertension     Past Surgical History:  Procedure Laterality Date  . LYMPHADENECTOMY  03/2018    Current Outpatient Medications  Medication Sig Dispense Refill Last Dose  . amLODipine (NORVASC) 5 MG tablet Take 1 tablet (5 mg total) by mouth daily. (Patient taking differently: Take 5 mg by mouth at bedtime. ) 90 tablet 3   . buPROPion (WELLBUTRIN XL) 150 MG 24 hr tablet Take 150 mg by mouth daily.     .  polycarbophil (FIBERCON) 625 MG tablet Take 1,250 mg by mouth daily.     . promethazine (PHENERGAN) 25 MG tablet Take 25 mg by mouth 2 (two) times daily as needed (nausea associated with kidney stones.).      Marland Kitchen rosuvastatin (CRESTOR) 20 MG tablet Take 20 mg by mouth at bedtime.     . tamsulosin (FLOMAX) 0.4 MG CAPS capsule Take 2 capsules by mouth at bedtime.     . traMADol (ULTRAM) 50 MG tablet Take 50 mg by mouth 2 (two) times daily as needed (pain associated with kidney stones.).      Marland Kitchen triamcinolone (NASACORT ALLERGY 24HR) 55 MCG/ACT AERO nasal inhaler Place 2 sprays into the nose daily.      No current facility-administered medications for this visit.   No Known Allergies  Social History   Tobacco Use  . Smoking status: Current Every Day Smoker    Packs/day: 1.00    Years: 45.00    Pack years: 45.00  . Smokeless tobacco: Never Used  Substance Use Topics  . Alcohol use: Not Currently    Family History  Problem Relation Age of Onset  . Lung disease Mother        Tumor  . CAD Father        S/pCABG     Review of Systems  HENT: Positive for hearing loss and tinnitus.   Musculoskeletal: Positive for arthralgias.  All other systems reviewed and are negative.   Objective:  Physical Exam  Constitutional: He is oriented to person, place, and time. He appears well-developed and well-nourished. No distress.  HENT:  Head: Normocephalic and atraumatic.  Eyes: Pupils are equal, round, and reactive to light. Conjunctivae and EOM are normal.  Cardiovascular: Normal rate and intact distal pulses.  No murmur heard. Respiratory: Effort normal and breath sounds normal. No respiratory distress. He has no wheezes.  GI: Soft. Bowel sounds are normal. He exhibits no distension. There is no abdominal tenderness.  Musculoskeletal:     Cervical back: Normal range of motion and neck supple.     Right hip: Tenderness and bony tenderness present. Decreased range of motion.  Neurological: He is  alert and oriented to person, place, and time.  Skin: Skin is warm and dry. No rash noted. No erythema.  Psychiatric: He has a normal mood and affect. His behavior is normal.    Vital signs in last 24 hours: @VSRANGES @  Labs:   Estimated body mass index is 30.29 kg/m as calculated from the following:   Height as of 07/16/19: 5\' 8"  (1.727 m).   Weight as of 07/16/19: 90.4 kg.   Imaging Review Plain radiographs demonstrate severe degenerative joint disease of the right hip(s). The bone quality appears to be good for age and reported activity level.      Assessment/Plan:  End stage arthritis, right hip(s)  The patient history, physical examination, clinical judgement of the provider and imaging studies are consistent with end stage degenerative joint disease of the right hip(s) and total hip arthroplasty is deemed medically necessary. The treatment options including medical management, injection therapy, arthroscopy and arthroplasty were discussed at length. The risks and benefits of total hip arthroplasty were presented and reviewed. The risks due to aseptic loosening, infection, stiffness, dislocation/subluxation,  thromboembolic complications and other imponderables were discussed.  The patient acknowledged the explanation, agreed to proceed with the plan and consent was signed. Patient is being admitted for inpatient treatment for surgery, pain control, PT, OT, prophylactic antibiotics, VTE prophylaxis, progressive ambulation and ADL's and discharge planning.The patient is planning to be discharged home with home health services   Anticipated LOS equal to or greater than 2 midnights due to - Age 66 and older with one or more of the following:  - Obesity  - Expected need for hospital services (PT, OT, Nursing) required for safe  discharge  - Anticipated need for postoperative skilled nursing care or inpatient rehab  - Active co-morbidities: Respiratory Failure/COPD OR   -  Unanticipated findings during/Post Surgery: None  - Patient is a high risk of re-admission due to: None

## 2019-08-03 ENCOUNTER — Encounter (HOSPITAL_COMMUNITY): Payer: Medicare Other

## 2019-08-03 ENCOUNTER — Inpatient Hospital Stay (HOSPITAL_COMMUNITY): Admission: RE | Admit: 2019-08-03 | Payer: Medicare Other | Source: Ambulatory Visit

## 2019-08-05 NOTE — Patient Instructions (Addendum)
DUE TO COVID-19 ONLY ONE VISITOR IS ALLOWED TO COME WITH YOU AND STAY IN THE WAITING ROOM ONLY DURING PRE OP AND PROCEDURE DAY OF SURGERY. THE 2 VISITORS  MAY VISIT WITH YOU AFTER SURGERY IN YOUR PRIVATE ROOM DURING VISITING HOURS ONLY!  YOU NEED TO HAVE A COVID 19 TEST ON__5/25_____ @__1 :05_____, THIS TEST MUST BE DONE BEFORE SURGERY, COME  [address], [place] , [address].  ([place]) ONCE YOUR COVID TEST IS COMPLETED, PLEASE BEGIN THE QUARANTINE INSTRUCTIONS AS OUTLINED IN YOUR HANDOUT.                [name]    Your procedure is scheduled on: 08/13/19   Report to [facility] Main  Entrance   Report to short stay 5:30 AM     Call this number if you have problems the morning of surgery [place], NO CHEWING GUM [place].   Do not eat food After Midnight.   YOU MAY HAVE CLEAR LIQUIDS FROM MIDNIGHT UNTIL 4:30 AM.   At 4:30AM Please finish the prescribed Pre-Surgery  drink.   Nothing by mouth after you finish the  drink !    Take these medicines the morning of surgery with A SIP OF WATER:  Wellbutrin, Flomax                                 You may not have any metal on your body including hair pins and              piercings  Do not wear jewelry, make-up, lotions, powders or perfumes, deodorant                       Men may shave face and neck.   Do not bring valuables to the hospital. [place].  Contacts, dentures or bridgework may not be worn into surgery.       .  Name and phone number of your driver:  Special Instructions: N/A              Please read over the following fact sheets you were given: _____________________________________________________________________             [facility] - Preparing for Surgery Before surgery, you can play an important role.   Because skin is not sterile, your  skin needs to be as free of germs as possible.   You can reduce the number of germs on your skin by washing with CHG (chlorahexidine gluconate) soap before surgery.   CHG is an antiseptic cleaner which kills germs and bonds with the skin to continue killing germs even after washing. Please DO NOT use if you have an allergy to CHG or antibacterial soaps.   If your skin becomes reddened/irritated stop using the CHG and inform your nurse when you arrive at Short Stay. .  You may shave your face/neck.  Please follow these instructions carefully:  1.  Shower with CHG Soap the night before surgery and the  morning of Surgery.  2.  If you choose to wash your hair, wash your hair first as usual with your  normal  shampoo.  3.  After you shampoo, rinse your  hair and body thoroughly to remove the  shampoo.                                        4.  Use CHG as you would any other liquid soap.  You can apply chg directly  to the skin and wash                       Gently with a scrungie or clean washcloth.  5.  Apply the CHG Soap to your body ONLY FROM THE NECK DOWN.   Do not use on face/ open                           Wound or open sores. Avoid contact with eyes, ears mouth and genitals (private parts).                       Wash face,  Genitals (private parts) with your normal soap.             6.  Wash thoroughly, paying special attention to the area where your surgery  will be performed.  7.  Thoroughly rinse your body with warm water from the neck down.  8.  DO NOT shower/wash with your normal soap after using and rinsing off  the CHG Soap.             9.  Pat yourself dry with a clean towel.            10.  Wear clean pajamas.            11.  Place clean sheets on your bed the night of your first shower and do not  sleep with pets. Day of Surgery : Do not apply any lotions/deodorants the morning of surgery.  Please wear clean clothes to the hospital/surgery center.  FAILURE TO FOLLOW THESE  INSTRUCTIONS MAY RESULT IN THE CANCELLATION OF YOUR SURGERY PATIENT SIGNATURE_________________________________  NURSE SIGNATURE__________________________________  ________________________________________________________________________   Adam Phenix  An incentive spirometer is a tool that can help keep your lungs clear and active. This tool measures how well you are filling your lungs with each breath. Taking long deep breaths may help reverse or decrease the chance of developing breathing (pulmonary) problems (especially infection) following:  A long period of time when you are unable to move or be active. BEFORE THE PROCEDURE   If the spirometer includes an indicator to show your best effort, your nurse or respiratory therapist will set it to a desired goal.  If possible, sit up straight or lean slightly forward. Try not to slouch.  Hold the incentive spirometer in an upright position. INSTRUCTIONS FOR USE  1. Sit on the edge of your bed if possible, or sit up as far as you can in bed or on a chair. 2. Hold the incentive spirometer in an upright position. 3. Breathe out normally. 4. Place the mouthpiece in your mouth and seal your lips tightly around it. 5. Breathe in slowly and as deeply as possible, raising the piston or the ball toward the top of the column. 6. Hold your breath for 3-5 seconds or for as long as possible. Allow the piston or ball to fall to the bottom of the column. 7. Remove the mouthpiece from your mouth and breathe out  normally. 8. Rest for a few seconds and repeat Steps 1 through 7 at least 10 times every 1-2 hours when you are awake. Take your time and take a few normal breaths between deep breaths. 9. The spirometer may include an indicator to show your best effort. Use the indicator as a goal to work toward during each repetition. 10. After each set of 10 deep breaths, practice coughing to be sure your lungs are clear. If you have an incision (the  cut made at the time of surgery), support your incision when coughing by placing a pillow or rolled up towels firmly against it. Once you are able to get out of bed, walk around indoors and cough well. You may stop using the incentive spirometer when instructed by your caregiver.  RISKS AND COMPLICATIONS  Take your time so you do not get dizzy or light-headed.  If you are in pain, you may need to take or ask for pain medication before doing incentive spirometry. It is harder to take a deep breath if you are having pain. AFTER USE  Rest and breathe slowly and easily.  It can be helpful to keep track of a log of your progress. Your caregiver can provide you with a simple table to help with this. If you are using the spirometer at home, follow these instructions: [place] IF:   You are having difficultly using the spirometer.  You have trouble using the spirometer as often as instructed.  Your pain medication is not giving enough relief while using the spirometer.  You develop fever of 100.5 F (38.1 C) or higher. SEEK IMMEDIATE MEDICAL CARE IF:   You cough up bloody sputum that had not been present before.  You develop fever of 102 F (38.9 C) or greater.  You develop worsening pain at or near the incision site. MAKE SURE YOU:   Understand these instructions.  Will watch your condition.  Will get help right away if you are not doing well or get worse. Document Released: 07/15/2006 Document Revised: 05/27/2011 Document Reviewed: 09/15/2006 [facility] Patient Information 2014 [place], [place].   ________________________________________________________________________

## 2019-08-06 ENCOUNTER — Encounter (HOSPITAL_COMMUNITY)
Admission: RE | Admit: 2019-08-06 | Discharge: 2019-08-06 | Disposition: A | Discharge status: Home / Self Care | Payer: Medicare Other | Source: Ambulatory Visit | Attending: Orthopedic Surgery | Admitting: Orthopedic Surgery

## 2019-08-06 ENCOUNTER — Encounter (HOSPITAL_COMMUNITY): Payer: Self-pay

## 2019-08-06 ENCOUNTER — Other Ambulatory Visit: Payer: Self-pay

## 2019-08-06 DIAGNOSIS — Z01812 Encounter for preprocedural laboratory examination: Secondary | ICD-10-CM | POA: Diagnosis not present

## 2019-08-06 HISTORY — DX: Personal history of urinary calculi: Z87.442

## 2019-08-06 HISTORY — DX: Unspecified osteoarthritis, unspecified site: M19.90

## 2019-08-06 LAB — COMPREHENSIVE METABOLIC PANEL
ALT: 22 U/L (ref 0–44)
AST: 16 U/L (ref 15–41)
Albumin: 3.7 g/dL (ref 3.5–5.0)
Alkaline Phosphatase: 75 U/L (ref 38–126)
Anion gap: 6 (ref 5–15)
BUN: 27 mg/dL — ABNORMAL HIGH (ref 8–23)
CO2: 26 mmol/L (ref 22–32)
Calcium: 8.8 mg/dL — ABNORMAL LOW (ref 8.9–10.3)
Chloride: 107 mmol/L (ref 98–111)
Creatinine, Ser: 1.16 mg/dL (ref 0.61–1.24)
GFR calc Af Amer: >60 mL/min (ref >60)
GFR calc non Af Amer: >60 mL/min (ref >60)
Glucose, Bld: 120 mg/dL — ABNORMAL HIGH (ref 70–99)
Potassium: 4.3 mmol/L (ref 3.5–5.1)
Sodium: 139 mmol/L (ref 135–145)
Total Bilirubin: 0.4 mg/dL (ref 0.3–1.2)
Total Protein: 6.6 g/dL (ref 6.5–8.1)

## 2019-08-06 LAB — SURGICAL PCR SCREEN
MRSA, PCR: NEGATIVE
Staphylococcus aureus: NEGATIVE

## 2019-08-06 LAB — CBC WITH DIFFERENTIAL/PLATELET
Abs Immature Granulocytes: 0.07 10*3/uL (ref 0.00–0.07)
Basophils Absolute: 0.1 10*3/uL (ref 0.0–0.1)
Basophils Relative: 1 %
Eosinophils Absolute: 0.5 10*3/uL (ref 0.0–0.5)
Eosinophils Relative: 5 %
HCT: 44 % (ref 39.0–52.0)
Hemoglobin: 14.5 g/dL (ref 13.0–17.0)
Immature Granulocytes: 1 %
Lymphocytes Relative: 27 %
Lymphs Abs: 2.4 10*3/uL (ref 0.7–4.0)
MCH: 30.1 pg (ref 26.0–34.0)
MCHC: 33 g/dL (ref 30.0–36.0)
MCV: 91.5 fL (ref 80.0–100.0)
Monocytes Absolute: 0.6 10*3/uL (ref 0.1–1.0)
Monocytes Relative: 7 %
Neutro Abs: 5.3 10*3/uL (ref 1.7–7.7)
Neutrophils Relative %: 59 %
Platelets: 287 10*3/uL (ref 150–400)
RBC: 4.81 MIL/uL (ref 4.22–5.81)
RDW: 13.7 % (ref 11.5–15.5)
WBC: 8.9 10*3/uL (ref 4.0–10.5)
nRBC: 0 % (ref 0.0–0.2)

## 2019-08-06 LAB — PROTIME-INR
INR: 1 (ref 0.8–1.2)
Prothrombin Time: 13 seconds (ref 11.4–15.2)

## 2019-08-06 LAB — APTT: aPTT: 32 seconds (ref 24–36)

## 2019-08-06 NOTE — Progress Notes (Signed)
PCP - Dr. Jerilynn Mages. [place] Cardiologist - Dr Langston Reusing  Chest x-ray - no EKG - 12/14/18 Stress Test - no ECHO - no Cardiac Cath - no  Sleep Study - no CPAP -   Fasting Blood Sugar - NA Checks Blood Sugar _____ times a day  Blood Thinner Instructions:NA Aspirin Instructions: Last Dose:  Anesthesia review:   Patient denies shortness of breath, fever, cough and chest pain at PAT appointment yes  Patient verbalized understanding of instructions that were given to them at the PAT appointment. Patient was also instructed that they will need to review over the PAT instructions again at home before surgery. yes

## 2019-08-10 ENCOUNTER — Other Ambulatory Visit (HOSPITAL_COMMUNITY)
Admission: RE | Admit: 2019-08-10 | Discharge: 2019-08-10 | Disposition: A | Discharge status: Home / Self Care | Payer: Medicare Other | Source: Ambulatory Visit | Attending: Orthopedic Surgery | Admitting: Orthopedic Surgery

## 2019-08-10 DIAGNOSIS — Z01812 Encounter for preprocedural laboratory examination: Secondary | ICD-10-CM | POA: Diagnosis present

## 2019-08-10 DIAGNOSIS — Z20822 Contact with and (suspected) exposure to covid-19: Secondary | ICD-10-CM | POA: Insufficient documentation

## 2019-08-10 LAB — SARS CORONAVIRUS 2 (TAT 6-24 HRS): SARS Coronavirus 2: NEGATIVE

## 2019-08-12 MED ORDER — TRANEXAMIC ACID 1000 MG/10ML IV SOLN
2000.0000 mg | INTRAVENOUS | Status: DC
  Filled 2019-08-12: qty 20

## 2019-08-12 MED ORDER — BUPIVACAINE LIPOSOME 1.3 % IJ SUSP
10.0000 mL | Freq: Once | INTRAMUSCULAR | Status: DC
  Filled 2019-08-12: qty 10

## 2019-08-12 NOTE — Anesthesia Preprocedure Evaluation (Addendum)
Anesthesia Evaluation  Patient identified by MRN, date of birth, ID band Patient awake    Reviewed: Allergy & Precautions, NPO status , Patient's Chart, lab work & pertinent test results  History of Anesthesia Complications Negative for: history of anesthetic complications  Airway Mallampati: II  TM Distance: >3 FB Neck ROM: Full    Dental  (+) Dental Advisory Given   Pulmonary COPD, Current Smoker and Patient abstained from smoking.,  08/10/2019 SARS coronavirus NEG   breath sounds clear to auscultation       Cardiovascular hypertension, Pt. on medications (-) angina Rhythm:Regular Rate:Normal     Neuro/Psych negative neurological ROS     GI/Hepatic negative GI ROS, Neg liver ROS,   Endo/Other  negative endocrine ROS  Renal/GU negative Renal ROS     Musculoskeletal  (+) Arthritis ,   Abdominal   Peds  Hematology negative hematology ROS (+) plt 267k   Anesthesia Other Findings   Reproductive/Obstetrics                            Anesthesia Physical Anesthesia Plan  ASA: II  Anesthesia Plan: Spinal   Post-op Pain Management:    Induction:   PONV Risk Score and Plan: 0 and Ondansetron  Airway Management Planned: Natural Airway and Simple Face Mask  Additional Equipment:   Intra-op Plan:   Post-operative Plan:   Informed Consent: I have reviewed the patients History and Physical, chart, labs and discussed the procedure including the risks, benefits and alternatives for the proposed anesthesia with the patient or authorized representative who has indicated his/her understanding and acceptance.     Dental advisory given  Plan Discussed with: CRNA and Surgeon  Anesthesia Plan Comments:        Anesthesia Quick Evaluation

## 2019-08-13 ENCOUNTER — Ambulatory Visit (HOSPITAL_COMMUNITY): Payer: Medicare Other | Admitting: Certified Registered"

## 2019-08-13 ENCOUNTER — Ambulatory Visit (HOSPITAL_COMMUNITY): Payer: Medicare Other

## 2019-08-13 ENCOUNTER — Encounter (HOSPITAL_COMMUNITY)
Admission: RE | Disposition: A | Discharge status: Home Health | Payer: Self-pay | Source: Ambulatory Visit | Attending: Orthopedic Surgery

## 2019-08-13 ENCOUNTER — Encounter (HOSPITAL_COMMUNITY): Payer: Self-pay | Admitting: Orthopedic Surgery

## 2019-08-13 ENCOUNTER — Ambulatory Visit (HOSPITAL_COMMUNITY)
Admission: RE | Admit: 2019-08-13 | Discharge: 2019-08-14 | Disposition: A | Discharge status: Home Health | Payer: Medicare Other | Source: Ambulatory Visit | Attending: Orthopedic Surgery | Admitting: Orthopedic Surgery

## 2019-08-13 DIAGNOSIS — E669 Obesity, unspecified: Secondary | ICD-10-CM | POA: Diagnosis not present

## 2019-08-13 DIAGNOSIS — F17210 Nicotine dependence, cigarettes, uncomplicated: Secondary | ICD-10-CM | POA: Insufficient documentation

## 2019-08-13 DIAGNOSIS — I2510 Atherosclerotic heart disease of native coronary artery without angina pectoris: Secondary | ICD-10-CM | POA: Diagnosis not present

## 2019-08-13 DIAGNOSIS — M1611 Unilateral primary osteoarthritis, right hip: Secondary | ICD-10-CM | POA: Diagnosis not present

## 2019-08-13 DIAGNOSIS — Z79899 Other long term (current) drug therapy: Secondary | ICD-10-CM | POA: Diagnosis not present

## 2019-08-13 DIAGNOSIS — E782 Mixed hyperlipidemia: Secondary | ICD-10-CM | POA: Insufficient documentation

## 2019-08-13 DIAGNOSIS — Z6829 Body mass index (BMI) 29.0-29.9, adult: Secondary | ICD-10-CM | POA: Diagnosis not present

## 2019-08-13 DIAGNOSIS — J449 Chronic obstructive pulmonary disease, unspecified: Secondary | ICD-10-CM | POA: Diagnosis not present

## 2019-08-13 DIAGNOSIS — I10 Essential (primary) hypertension: Secondary | ICD-10-CM | POA: Diagnosis not present

## 2019-08-13 HISTORY — PX: TOTAL HIP ARTHROPLASTY: SHX124

## 2019-08-13 SURGERY — ARTHROPLASTY, HIP, TOTAL,POSTERIOR APPROACH
Anesthesia: Spinal | Site: Hip | Laterality: Right

## 2019-08-13 MED ORDER — ONDANSETRON HCL 4 MG/2ML IJ SOLN
INTRAMUSCULAR | Status: DC | PRN
  Administered 2019-08-13: 4 mg via INTRAVENOUS

## 2019-08-13 MED ORDER — BUPIVACAINE HCL 0.25 % IJ SOLN
INTRAMUSCULAR | Status: AC
  Filled 2019-08-13: qty 1

## 2019-08-13 MED ORDER — BISACODYL 5 MG PO TBEC: 5.0000 mg | DELAYED_RELEASE_TABLET | Freq: Every day | ORAL | Status: DC | PRN

## 2019-08-13 MED ORDER — CEFAZOLIN SODIUM-DEXTROSE 1-4 GM/50ML-% IV SOLN
1.0000 g | Freq: Four times a day (QID) | INTRAVENOUS | Status: AC
  Administered 2019-08-13 (×2): 1 g via INTRAVENOUS
  Filled 2019-08-13 (×2): qty 50

## 2019-08-13 MED ORDER — BUPIVACAINE HCL (PF) 0.25 % IJ SOLN
INTRAMUSCULAR | Status: DC | PRN
  Administered 2019-08-13: 20 mL

## 2019-08-13 MED ORDER — DEXAMETHASONE SODIUM PHOSPHATE 10 MG/ML IJ SOLN
INTRAMUSCULAR | Status: AC
  Filled 2019-08-13: qty 1

## 2019-08-13 MED ORDER — PROPOFOL 1000 MG/100ML IV EMUL
INTRAVENOUS | Status: AC
  Filled 2019-08-13: qty 100

## 2019-08-13 MED ORDER — PROPOFOL 500 MG/50ML IV EMUL
INTRAVENOUS | Status: DC | PRN
  Administered 2019-08-13: 50 ug/kg/min via INTRAVENOUS

## 2019-08-13 MED ORDER — MIDAZOLAM HCL 2 MG/2ML IJ SOLN
INTRAMUSCULAR | Status: DC | PRN
  Administered 2019-08-13: 2 mg via INTRAVENOUS
  Administered 2019-08-13 (×2): .5 mg via INTRAVENOUS

## 2019-08-13 MED ORDER — FENTANYL CITRATE (PF) 100 MCG/2ML IJ SOLN
INTRAMUSCULAR | Status: DC | PRN
  Administered 2019-08-13 (×2): 50 ug via INTRAVENOUS

## 2019-08-13 MED ORDER — EPHEDRINE 5 MG/ML INJ
INTRAVENOUS | Status: AC
  Filled 2019-08-13: qty 10

## 2019-08-13 MED ORDER — TRANEXAMIC ACID-NACL 1000-0.7 MG/100ML-% IV SOLN
1000.0000 mg | INTRAVENOUS | Status: AC
  Administered 2019-08-13: 1000 mg via INTRAVENOUS
  Filled 2019-08-13: qty 100

## 2019-08-13 MED ORDER — ONDANSETRON HCL 4 MG/2ML IJ SOLN
INTRAMUSCULAR | Status: AC
  Filled 2019-08-13: qty 2

## 2019-08-13 MED ORDER — ACETAMINOPHEN 500 MG PO TABS
1000.0000 mg | ORAL_TABLET | Freq: Once | ORAL | Status: AC
  Administered 2019-08-13: 1000 mg via ORAL
  Filled 2019-08-13: qty 2

## 2019-08-13 MED ORDER — ROSUVASTATIN CALCIUM 20 MG PO TABS
20.0000 mg | ORAL_TABLET | Freq: Every day | ORAL | Status: DC
  Administered 2019-08-13: 20 mg via ORAL
  Filled 2019-08-13: qty 1

## 2019-08-13 MED ORDER — LIDOCAINE 2% (20 MG/ML) 5 ML SYRINGE
INTRAMUSCULAR | Status: DC | PRN
  Administered 2019-08-13: 40 mg via INTRAVENOUS

## 2019-08-13 MED ORDER — LACTATED RINGERS IV SOLN: INTRAVENOUS | Status: DC

## 2019-08-13 MED ORDER — TAMSULOSIN HCL 0.4 MG PO CAPS: 0.8000 mg | ORAL_CAPSULE | Freq: Every day | ORAL | Status: DC

## 2019-08-13 MED ORDER — MORPHINE SULFATE (PF) 2 MG/ML IV SOLN
0.5000 mg | INTRAVENOUS | Status: DC | PRN
  Administered 2019-08-13: 1 mg via INTRAVENOUS
  Filled 2019-08-13: qty 1

## 2019-08-13 MED ORDER — MIDAZOLAM HCL 2 MG/2ML IJ SOLN
INTRAMUSCULAR | Status: AC
  Filled 2019-08-13: qty 2

## 2019-08-13 MED ORDER — ALBUMIN HUMAN 5 % IV SOLN
INTRAVENOUS | Status: AC
  Filled 2019-08-13: qty 250

## 2019-08-13 MED ORDER — PHENOL 1.4 % MT LIQD: 1.0000 | OROMUCOSAL | Status: DC | PRN

## 2019-08-13 MED ORDER — MIDAZOLAM HCL 2 MG/2ML IJ SOLN: 0.5000 mg | Freq: Once | INTRAMUSCULAR | Status: DC | PRN

## 2019-08-13 MED ORDER — LACTATED RINGERS IV BOLUS: 250.0000 mL | Freq: Once | INTRAVENOUS | Status: DC

## 2019-08-13 MED ORDER — PHENYLEPHRINE 40 MCG/ML (10ML) SYRINGE FOR IV PUSH (FOR BLOOD PRESSURE SUPPORT)
PREFILLED_SYRINGE | INTRAVENOUS | Status: AC
  Filled 2019-08-13: qty 10

## 2019-08-13 MED ORDER — PROPOFOL 10 MG/ML IV BOLUS
INTRAVENOUS | Status: AC
  Filled 2019-08-13: qty 20

## 2019-08-13 MED ORDER — CEFAZOLIN SODIUM-DEXTROSE 2-4 GM/100ML-% IV SOLN
2.0000 g | INTRAVENOUS | Status: AC
  Administered 2019-08-13: 2 g via INTRAVENOUS
  Filled 2019-08-13: qty 100

## 2019-08-13 MED ORDER — BUPIVACAINE LIPOSOME 1.3 % IJ SUSP
INTRAMUSCULAR | Status: DC | PRN
  Administered 2019-08-13: 10 mL

## 2019-08-13 MED ORDER — SENNOSIDES-DOCUSATE SODIUM 8.6-50 MG PO TABS: 1.0000 | ORAL_TABLET | Freq: Every evening | ORAL | Status: DC | PRN

## 2019-08-13 MED ORDER — PHENYLEPHRINE HCL (PRESSORS) 10 MG/ML IV SOLN
INTRAVENOUS | Status: AC
  Filled 2019-08-13: qty 1

## 2019-08-13 MED ORDER — HYDROMORPHONE HCL 1 MG/ML IJ SOLN
INTRAMUSCULAR | Status: AC
  Filled 2019-08-13: qty 1

## 2019-08-13 MED ORDER — ASPIRIN EC 81 MG PO TBEC
81.0000 mg | DELAYED_RELEASE_TABLET | Freq: Two times a day (BID) | ORAL | 0 refills | Status: AC
Start: 2019-08-13 — End: 2019-09-12

## 2019-08-13 MED ORDER — FLEET ENEMA 7-19 GM/118ML RE ENEM: 1.0000 | ENEMA | Freq: Once | RECTAL | Status: DC | PRN

## 2019-08-13 MED ORDER — LIDOCAINE 2% (20 MG/ML) 5 ML SYRINGE
INTRAMUSCULAR | Status: AC
  Filled 2019-08-13: qty 5

## 2019-08-13 MED ORDER — BUPIVACAINE IN DEXTROSE 0.75-8.25 % IT SOLN
INTRATHECAL | Status: DC | PRN
  Administered 2019-08-13: 2 mL via INTRATHECAL

## 2019-08-13 MED ORDER — WATER FOR IRRIGATION, STERILE IR SOLN
Status: DC | PRN
  Administered 2019-08-13: 2000 mL

## 2019-08-13 MED ORDER — HYDROCODONE-ACETAMINOPHEN 5-325 MG PO TABS: ORAL_TABLET | ORAL | 0 refills | Status: DC

## 2019-08-13 MED ORDER — SODIUM CHLORIDE 0.9 % IV SOLN: INTRAVENOUS | Status: DC

## 2019-08-13 MED ORDER — EPHEDRINE SULFATE-NACL 50-0.9 MG/10ML-% IV SOSY
PREFILLED_SYRINGE | INTRAVENOUS | Status: DC | PRN
  Administered 2019-08-13: 5 mg via INTRAVENOUS
  Administered 2019-08-13 (×4): 10 mg via INTRAVENOUS

## 2019-08-13 MED ORDER — METOCLOPRAMIDE HCL 5 MG PO TABS: 5.0000 mg | ORAL_TABLET | Freq: Three times a day (TID) | ORAL | Status: DC | PRN

## 2019-08-13 MED ORDER — ORAL CARE MOUTH RINSE: 15.0000 mL | Freq: Once | OROMUCOSAL | Status: AC

## 2019-08-13 MED ORDER — ASPIRIN 81 MG PO CHEW
81.0000 mg | CHEWABLE_TABLET | Freq: Two times a day (BID) | ORAL | Status: DC
  Administered 2019-08-13 – 2019-08-14 (×2): 81 mg via ORAL
  Filled 2019-08-13 (×2): qty 1

## 2019-08-13 MED ORDER — DEXAMETHASONE SODIUM PHOSPHATE 10 MG/ML IJ SOLN
INTRAMUSCULAR | Status: DC | PRN
  Administered 2019-08-13: 8 mg via INTRAVENOUS

## 2019-08-13 MED ORDER — PHENYLEPHRINE 40 MCG/ML (10ML) SYRINGE FOR IV PUSH (FOR BLOOD PRESSURE SUPPORT)
PREFILLED_SYRINGE | INTRAVENOUS | Status: DC | PRN
  Administered 2019-08-13 (×2): 80 ug via INTRAVENOUS
  Administered 2019-08-13 (×3): 40 ug via INTRAVENOUS

## 2019-08-13 MED ORDER — CALCIUM POLYCARBOPHIL 625 MG PO TABS
1250.0000 mg | ORAL_TABLET | Freq: Every day | ORAL | Status: DC
  Administered 2019-08-13 – 2019-08-14 (×2): 1250 mg via ORAL
  Filled 2019-08-13 (×2): qty 2

## 2019-08-13 MED ORDER — 3-IN-1 BEDSIDE TOILET MISC: 0 refills | Status: DC

## 2019-08-13 MED ORDER — TRANEXAMIC ACID-NACL 1000-0.7 MG/100ML-% IV SOLN
1000.0000 mg | Freq: Once | INTRAVENOUS | Status: AC
  Administered 2019-08-13: 1000 mg via INTRAVENOUS
  Filled 2019-08-13: qty 100

## 2019-08-13 MED ORDER — HYDROCODONE-ACETAMINOPHEN 5-325 MG PO TABS
1.0000 | ORAL_TABLET | ORAL | Status: DC | PRN
  Administered 2019-08-13 – 2019-08-14 (×4): 2 via ORAL
  Filled 2019-08-13 (×4): qty 2

## 2019-08-13 MED ORDER — ALBUMIN HUMAN 5 % IV SOLN
INTRAVENOUS | Status: DC | PRN
Start: 2019-08-13 — End: 2019-08-13

## 2019-08-13 MED ORDER — PROMETHAZINE HCL 25 MG/ML IJ SOLN: 6.2500 mg | INTRAMUSCULAR | Status: DC | PRN

## 2019-08-13 MED ORDER — BUPROPION HCL ER (XL) 150 MG PO TB24
150.0000 mg | ORAL_TABLET | Freq: Every day | ORAL | Status: DC
  Administered 2019-08-14: 150 mg via ORAL
  Filled 2019-08-13: qty 1

## 2019-08-13 MED ORDER — TRANEXAMIC ACID 1000 MG/10ML IV SOLN
INTRAVENOUS | Status: DC | PRN
  Administered 2019-08-13: 2000 mg via TOPICAL

## 2019-08-13 MED ORDER — MEPERIDINE HCL 50 MG/ML IJ SOLN: 6.2500 mg | INTRAMUSCULAR | Status: DC | PRN

## 2019-08-13 MED ORDER — CHLORHEXIDINE GLUCONATE 0.12 % MT SOLN
15.0000 mL | Freq: Once | OROMUCOSAL | Status: AC
  Administered 2019-08-13: 15 mL via OROMUCOSAL

## 2019-08-13 MED ORDER — TRAMADOL HCL 50 MG PO TABS
50.0000 mg | ORAL_TABLET | Freq: Four times a day (QID) | ORAL | Status: DC
  Administered 2019-08-13 – 2019-08-14 (×5): 50 mg via ORAL
  Filled 2019-08-13 (×5): qty 1

## 2019-08-13 MED ORDER — POVIDONE-IODINE 10 % EX SWAB
2.0000 "application " | Freq: Once | CUTANEOUS | Status: AC
  Administered 2019-08-13: 2 via TOPICAL

## 2019-08-13 MED ORDER — HYDROMORPHONE HCL 1 MG/ML IJ SOLN
0.2500 mg | INTRAMUSCULAR | Status: DC | PRN
  Administered 2019-08-13: 0.5 mg via INTRAVENOUS

## 2019-08-13 MED ORDER — DOCUSATE SODIUM 100 MG PO CAPS
100.0000 mg | ORAL_CAPSULE | Freq: Two times a day (BID) | ORAL | Status: DC
  Administered 2019-08-13 – 2019-08-14 (×2): 100 mg via ORAL
  Filled 2019-08-13 (×2): qty 1

## 2019-08-13 MED ORDER — SODIUM CHLORIDE (PF) 0.9 % IJ SOLN
INTRAMUSCULAR | Status: DC | PRN
  Administered 2019-08-13: 20 mL

## 2019-08-13 MED ORDER — ACETAMINOPHEN 325 MG PO TABS: 325.0000 mg | ORAL_TABLET | Freq: Four times a day (QID) | ORAL | Status: DC | PRN

## 2019-08-13 MED ORDER — ONDANSETRON HCL 4 MG PO TABS: 4.0000 mg | ORAL_TABLET | Freq: Four times a day (QID) | ORAL | Status: DC | PRN

## 2019-08-13 MED ORDER — METOCLOPRAMIDE HCL 5 MG/ML IJ SOLN: 5.0000 mg | Freq: Three times a day (TID) | INTRAMUSCULAR | Status: DC | PRN

## 2019-08-13 MED ORDER — SODIUM CHLORIDE (PF) 0.9 % IJ SOLN
INTRAMUSCULAR | Status: AC
  Filled 2019-08-13: qty 20

## 2019-08-13 MED ORDER — LACTATED RINGERS IV BOLUS: 500.0000 mL | Freq: Once | INTRAVENOUS | Status: DC

## 2019-08-13 MED ORDER — AMLODIPINE BESYLATE 5 MG PO TABS
5.0000 mg | ORAL_TABLET | Freq: Every day | ORAL | Status: DC
  Administered 2019-08-13: 5 mg via ORAL
  Filled 2019-08-13: qty 1

## 2019-08-13 MED ORDER — ONDANSETRON HCL 4 MG/2ML IJ SOLN: 4.0000 mg | Freq: Four times a day (QID) | INTRAMUSCULAR | Status: DC | PRN

## 2019-08-13 MED ORDER — SODIUM CHLORIDE 0.9 % IR SOLN
Status: DC | PRN
  Administered 2019-08-13: 1000 mL

## 2019-08-13 MED ORDER — MENTHOL 3 MG MT LOZG: 1.0000 | LOZENGE | OROMUCOSAL | Status: DC | PRN

## 2019-08-13 MED ORDER — PHENYLEPHRINE HCL-NACL 10-0.9 MG/250ML-% IV SOLN
INTRAVENOUS | Status: DC | PRN
  Administered 2019-08-13: 40 ug/min via INTRAVENOUS

## 2019-08-13 MED ORDER — FENTANYL CITRATE (PF) 100 MCG/2ML IJ SOLN
INTRAMUSCULAR | Status: AC
  Filled 2019-08-13: qty 2

## 2019-08-13 SURGICAL SUPPLY — 55 items
BAG DECANTER FOR FLEXI CONT (MISCELLANEOUS) ×2 IMPLANT
BAG ZIPLOCK 12X15 (MISCELLANEOUS) ×2 IMPLANT
BENZOIN TINCTURE PRP APPL 2/3 (GAUZE/BANDAGES/DRESSINGS) ×2 IMPLANT
BLADE SAW SGTL 73X25 THK (BLADE) ×2 IMPLANT
CLSR STERI-STRIP ANTIMIC 1/2X4 (GAUZE/BANDAGES/DRESSINGS) ×2 IMPLANT
COVER SURGICAL LIGHT HANDLE (MISCELLANEOUS) ×2 IMPLANT
COVER WAND RF STERILE (DRAPES) IMPLANT
DECANTER SPIKE VIAL GLASS SM (MISCELLANEOUS) ×6 IMPLANT
DRAPE 3/4 80X56 (DRAPES) ×2 IMPLANT
DRAPE INCISE IOBAN 66X45 STRL (DRAPES) ×2 IMPLANT
DRAPE ORTHO SPLIT 77X108 STRL (DRAPES) ×4
DRAPE POUCH INSTRU U-SHP 10X18 (DRAPES) ×2 IMPLANT
DRAPE SURG ORHT 6 SPLT 77X108 (DRAPES) ×2 IMPLANT
DRAPE U-SHAPE 47X51 STRL (DRAPES) ×2 IMPLANT
DRESSING AQUACEL AG SP 3.5X10 (GAUZE/BANDAGES/DRESSINGS) ×1 IMPLANT
DRSG AQUACEL AG SP 3.5X10 (GAUZE/BANDAGES/DRESSINGS) ×2
DURAPREP 26ML APPLICATOR (WOUND CARE) ×2 IMPLANT
ELECT BLADE TIP CTD 4 INCH (ELECTRODE) ×2 IMPLANT
ELECT REM PT RETURN 15FT ADLT (MISCELLANEOUS) ×2 IMPLANT
FACESHIELD WRAPAROUND (MASK) ×6 IMPLANT
GLOVE BIOGEL PI IND STRL 8 (GLOVE) ×2 IMPLANT
GLOVE BIOGEL PI INDICATOR 8 (GLOVE) ×2
GLOVE SURG ORTHO 8.0 STRL STRW (GLOVE) ×2 IMPLANT
GLOVE SURG SS PI 7.5 STRL IVOR (GLOVE) ×2 IMPLANT
GOWN STRL REUS W/TWL XL LVL3 (GOWN DISPOSABLE) ×4 IMPLANT
HEAD CERAMIC 36 PLUS5 (Hips) ×2 IMPLANT
HOLDER FOLEY CATH W/STRAP (MISCELLANEOUS) ×2 IMPLANT
HOOD PEEL AWAY FLYTE STAYCOOL (MISCELLANEOUS) ×2 IMPLANT
IMMOBILIZER KNEE 20 (SOFTGOODS) ×2
IMMOBILIZER KNEE 20 THIGH 36 (SOFTGOODS) ×1 IMPLANT
KIT BASIN (CUSTOM PROCEDURE TRAY) ×2 IMPLANT
KIT TURNOVER KIT A (KITS) IMPLANT
LINER NEUTRAL 52X36X52 PLUS 4 (Liner) ×2 IMPLANT
MANIFOLD NEPTUNE II (INSTRUMENTS) ×2 IMPLANT
NEEDLE HYPO 22GX1.5 SAFETY (NEEDLE) ×4 IMPLANT
NS IRRIG 1000ML POUR BTL (IV SOLUTION) ×2 IMPLANT
PACK TOTAL JOINT (CUSTOM PROCEDURE TRAY) ×2 IMPLANT
PENCIL SMOKE EVACUATOR (MISCELLANEOUS) IMPLANT
PIN SECTOR W/GRIP ACE CUP 52MM (Hips) ×2 IMPLANT
PROTECTOR NERVE ULNAR (MISCELLANEOUS) ×2 IMPLANT
STAPLER VISISTAT 35W (STAPLE) IMPLANT
STEM FEM CMNTLSS SM AML 15.0 (Hips) ×2 IMPLANT
STRIP CLOSURE SKIN 1/2X4 (GAUZE/BANDAGES/DRESSINGS) ×4 IMPLANT
SUCTION FRAZIER HANDLE 12FR (TUBING) ×2
SUCTION TUBE FRAZIER 12FR DISP (TUBING) ×1 IMPLANT
SUT ETHIBOND NAB CT1 #1 30IN (SUTURE) ×8 IMPLANT
SUT MNCRL AB 3-0 PS2 18 (SUTURE) ×2 IMPLANT
SUT VIC AB 0 CT1 36 (SUTURE) ×4 IMPLANT
SUT VIC AB 2-0 CT1 27 (SUTURE) ×4
SUT VIC AB 2-0 CT1 TAPERPNT 27 (SUTURE) ×2 IMPLANT
SYR CONTROL 10ML LL (SYRINGE) ×4 IMPLANT
TOWEL OR 17X26 10 PK STRL BLUE (TOWEL DISPOSABLE) ×2 IMPLANT
TRAY FOLEY MTR SLVR 16FR STAT (SET/KITS/TRAYS/PACK) ×2 IMPLANT
WATER STERILE IRR 1000ML POUR (IV SOLUTION) ×4 IMPLANT
YANKAUER SUCT BULB TIP 10FT TU (MISCELLANEOUS) ×2 IMPLANT

## 2019-08-13 NOTE — Anesthesia Postprocedure Evaluation (Signed)
Anesthesia Post Note  Patient: Nathan Porter  Procedure(s) Performed: TOTAL HIP ARTHROPLASTY (Right Hip)     Patient location during evaluation: PACU Anesthesia Type: Spinal Level of consciousness: awake and alert, patient cooperative and oriented Pain management: pain level controlled Vital Signs Assessment: post-procedure vital signs reviewed and stable Respiratory status: spontaneous breathing, nonlabored ventilation and respiratory function stable Cardiovascular status: blood pressure returned to baseline and stable Postop Assessment: no apparent nausea or vomiting, spinal receding and patient able to bend at knees Anesthetic complications: no    Last Vitals:  Vitals:   08/13/19 1045 08/13/19 1102  BP: 104/73 128/75  Pulse: 71 76  Resp: 16   Temp: (!) 36.3 C (!) 36.4 C  SpO2: 93% 95%    Last Pain:  Vitals:   08/13/19 1045  TempSrc:   PainSc: Asleep                 Kasir Hallenbeck,E. Delorse Shane

## 2019-08-13 NOTE — Interval H&P Note (Signed)
History and Physical Interval Note:  08/13/2019 7:28 AM  Nathan Porter  has presented today for surgery, with the diagnosis of OSTEOARTHRITIS RIGHT HIP.  The various methods of treatment have been discussed with the patient and family. After consideration of risks, benefits and other options for treatment, the patient has consented to  Procedure(s): TOTAL HIP ARTHROPLASTY (Right) as a surgical intervention.  The patient's history has been reviewed, patient examined, no change in status, stable for surgery.  I have reviewed the patient's chart and labs.  Questions were answered to the patient's satisfaction.     Yvette Rack

## 2019-08-13 NOTE — Transfer of Care (Signed)
Immediate Anesthesia Transfer of Care Note  Patient: Nathan Porter  Procedure(s) Performed: TOTAL HIP ARTHROPLASTY (Right Hip)  Patient Location: PACU  Anesthesia Type:Spinal  Level of Consciousness: awake, alert  and oriented  Airway & Oxygen Therapy: Patient Spontanous Breathing and Patient connected to face mask oxygen  Post-op Assessment: Report given to RN and Post -op Vital signs reviewed and stable  Post vital signs: Reviewed and stable  Last Vitals:  Vitals Value Taken Time  BP 132/77 08/13/19 0956  Temp    Pulse 88 08/13/19 1000  Resp 17 08/13/19 1000  SpO2 99 % 08/13/19 1000  Vitals shown include unvalidated device data.  Last Pain:  Vitals:   08/13/19 0618  TempSrc: Oral         Complications: No apparent anesthesia complications

## 2019-08-13 NOTE — Anesthesia Procedure Notes (Signed)
Spinal  Patient location during procedure: OR End time: 08/13/2019 7:42 AM Staffing Performed: anesthesiologist  Anesthesiologist: Annye Asa, MD Preanesthetic Checklist Completed: patient identified, IV checked, site marked, risks and benefits discussed, surgical consent, monitors and equipment checked, pre-op evaluation and timeout performed Spinal Block Patient position: sitting Prep: DuraPrep and site prepped and draped Patient monitoring: blood pressure, continuous pulse ox, cardiac monitor and heart rate Approach: midline Location: L3-4 Injection technique: single-shot Needle Needle type: Pencan  Needle gauge: 24 G Needle length: 9 cm Additional Notes Pt identified in Operating room.  Monitors applied. Working IV access confirmed. Sterile prep, drape lumbar spine.  1% lido local L 3,4.  Several attempts CRNA os, then [facility] into clear CSF L 3,4.  15mg  0.75% Bupivacaine with dextrose injected with asp CSF beginning and end of injection.  Patient asymptomatic, VSS, no heme aspirated, tolerated well.  Jenita Seashore, MD

## 2019-08-13 NOTE — Brief Op Note (Signed)
08/13/2019  9:56 AM  PATIENT:  [name]  64 y.o. male  PRE-OPERATIVE DIAGNOSIS:  OSTEOARTHRITIS RIGHT HIP  POST-OPERATIVE DIAGNOSIS:  OSTEOARTHRITIS RIGHT HIP  PROCEDURE:  Procedure(s): TOTAL HIP ARTHROPLASTY (Right)  SURGEON:  Surgeon(s) and Role:    Earlie Server, MD - Primary  PHYSICIAN ASSISTANT: Chriss Czar, PA-C  ASSISTANTS: OR staff x1   ANESTHESIA:   local, spinal and IV sedation  EBL:  350 mL   BLOOD ADMINISTERED:none  DRAINS: none   LOCAL MEDICATIONS USED:  MARCAINE     SPECIMEN:  No Specimen  DISPOSITION OF SPECIMEN:  N/A  COUNTS:  YES  TOURNIQUET:  * No tourniquets in log *  DICTATION: .Other Dictation: Dictation Number unknown  PLAN OF CARE: Admit for overnight observation vs possible discharge home today  PATIENT DISPOSITION:  PACU - hemodynamically stable.   Delay start of Pharmacological VTE agent (>24hrs) due to surgical blood loss or risk of bleeding: yes

## 2019-08-13 NOTE — Anesthesia Procedure Notes (Signed)
Procedure Name: MAC Date/Time: 08/13/2019 7:34 AM Performed by: Eben Burow, CRNA Pre-anesthesia Checklist: Patient identified, Emergency Drugs available, Suction available, Patient being monitored and Timeout performed Oxygen Delivery Method: Simple face mask

## 2019-08-13 NOTE — Discharge Instructions (Signed)

## 2019-08-13 NOTE — Op Note (Signed)
NAME: Nathan Porter, Nathan Porter MEDICAL RECORD [id number] ACCOUNT [id number] DATE OF BIRTH:05/13/1955 FACILITY: [facility] LOCATION: [facility] PHYSICIAN:W. Garnett Rekowski JR., MD  OPERATIVE REPORT  DATE OF PROCEDURE:  08/13/2019  PREOPERATIVE DIAGNOSIS:  Severe osteoarthritis, right hip.  POSTOPERATIVE DIAGNOSIS:  Severe osteoarthritis, right hip.   OPERATION:  Right total hip replacement (DePuy AML small stature stem 15.0 diameter with 52 mm Pinnacle Gription acetabular shell cup with Pinnacle Altrex acetabular liner +4 mm 10 degrees with +5 mm Biolox ceramic 36 mm head.  SURGEON:  Vangie Bicker, MD  ASSISTANTMarjo Bicker, MD   ANESTHESIA:  Spinal.  ESTIMATED BLOOD LOSS:  350 mL.  DESCRIPTION OF PROCEDURE:  Lateral position posterior approach to the hip, made with splitting of the iliotibial band and gluteus maximus fascia.  T capsulotomy made in the hip; dislocated the hip.  Severe osteoarthritis was noted with posterior superior  erosion of the acetabulum.  We progressively broached and reamed to accept a small stature 15 mm stem.  Acetabular retractors placed anterior and posterior with wing retractors superiorly, posteriorly.  We identified the edges of the acetabulum and resected the labrum, progressively reamed the acetabulum to accept a 52 mm cup with 1 mm under reaming with  about 20 degrees of anteversion and 45 degrees of abduction.  We placed the final cup with a trial liner with the lining built up at about the 9 o'clock position.  Trialed off the broach, deemed to be satisfactory fit between the 1.5 and 5 mm length.  We  then placed the final liner cup with the buildup at 9:00 followed by placement of the stem.  Trialed off the stem.  Decided that the +5 mm neck length provided excellent stability, restitution of leg lengths with no tendency to dislocate except with  flexion past 90 adduction and internal rotation approaching 90 degrees.  Wound was irrigated.  Closure was affected on  the iliotibial band with a running Ethibond, subcutaneous tissues with 2-0 Vicryl and the skin with Monocryl.  A lightly compressive  sterile dressing and knee immobilizer applied.  Taken to recovery room in stable condition.  CN/NUANCE  D:08/13/2019 T:08/13/2019 JOB:[id number]

## 2019-08-13 NOTE — Progress Notes (Signed)
Orthopedic Tech Progress Note Patient Details:  Nathan Porter [birth year] [id number]  Ortho Devices Ortho Device/Splint Location: Trapeze bar Ortho Device/Splint Interventions: Application   Post Interventions Patient Tolerated: Well Instructions Provided: Care of device   [name] 08/13/2019, 11:44 AM

## 2019-08-13 NOTE — Evaluation (Signed)
Physical Therapy Evaluation Patient Details Name: Nathan Porter MRN: [id number] DOB: July 03, 1955 Today's Date: 08/13/2019   History of Present Illness  Patient is 64 y.o. male s/p Rt THA posterior approach with PMH significant for HTN, HLD, COPD, OA.  Clinical Impression  Nathan Porter is a 64 y.o. male POD 0 s/p Rt THA with posterior hip precautions. Patient reports indepdendence with mobility at baseline. Patient is now limited by functional impairments (see PT problem list below) and requires min assist for transfers and gait with RW. Patient was able to ambulate ~70 feet with RW and min assist. Patient instructed in exercise to facilitate circulation. Patient will benefit from continued skilled PT interventions to address impairments and progress towards PLOF. Acute PT will follow to progress mobility and stair training in preparation for safe discharge home.   Pt's BP noted to be low after gait 93/63 HR of 66 bpm and pt reported nausea and feeling warm. After seated rest BP improved to 102/69 HR of 75 bpm and pt reported some improvement in nausea. Pt saturating at 90% on 2L/min at EOS and increased to 3L/min with improvement of 94%.    Follow Up Recommendations Follow surgeon's recommendation for DC plan and follow-up therapies;Home health PT    Equipment Recommendations  None recommended by PT    Recommendations for Other Services       Precautions / Restrictions Precautions Precautions: Fall Restrictions Weight Bearing Restrictions: No      Mobility  Bed Mobility Overal bed mobility: Needs Assistance Bed Mobility: Supine to Sit     Supine to sit: HOB elevated;Min assist     General bed mobility comments: cues for use of bed rail and assist to manage Rt LE and mobilize to EOB.  Transfers Overall transfer level: Needs assistance Equipment used: Rolling walker (2 wheeled) Transfers: Sit to/from Stand Sit to Stand: Min assist         General transfer comment: cues  for safe technique with RW, cues to maintain posterior hip precautions. assist required to complete power up and steady.  Ambulation/Gait Ambulation/Gait assistance: Min assist Gait Distance (Feet): 60 Feet Assistive device: Rolling walker (2 wheeled) Gait Pattern/deviations: Step-to pattern;Decreased weight shift to right Gait velocity: fair   General Gait Details: cues required for safety throughout to decresae step length as pt was attetmpting to overstride and ambulate too quickly for safety. pt required repeated reminders for safe step pattern within RW.   Stairs            Wheelchair Mobility    Modified Rankin (Stroke Patients Only)       Balance Overall balance assessment: Needs assistance Sitting-balance support: Feet supported Sitting balance-Leahy Scale: Fair     Standing balance support: During functional activity;Bilateral upper extremity supported Standing balance-Leahy Scale: Poor          Pertinent Vitals/Pain Pain Assessment: 0-10 Pain Score: 6  Pain Location: Rt hip Pain Descriptors / Indicators: Aching;Burning;Discomfort Pain Intervention(s): Limited activity within patient's tolerance;Monitored during session;Repositioned;Ice applied    Home Living Family/patient expects to be discharged to:: Private residence Living Arrangements: Spouse/significant other Available Help at Discharge: Family Type of Home: House Home Access: Stairs to enter Entrance Stairs-Rails: None Entrance Stairs-Number of Steps: 1 Home Layout: Two level;Bed/bath upstairs;Able to live on main level with bedroom/bathroom;1/2 bath on main level Home Equipment: Walker - 2 wheels;Cane - single point;Grab bars - tub/shower;Shower seat - built in      Prior Function Level of Independence: Independent  Comments: using cane occasionally     Hand Dominance   Dominant Hand: Right    Extremity/Trunk Assessment   Upper Extremity Assessment Upper Extremity  Assessment: Overall WFL for tasks assessed    Lower Extremity Assessment Lower Extremity Assessment: RLE deficits/detail RLE: Unable to fully assess due to immobilization RLE Sensation: WNL RLE Coordination: WNL    Cervical / Trunk Assessment Cervical / Trunk Assessment: Normal  Communication   Communication: No difficulties  Cognition Arousal/Alertness: Awake/alert Behavior During Therapy: WFL for tasks assessed/performed Overall Cognitive Status: Within Functional Limits for tasks assessed             General Comments      Exercises Total Joint Exercises Ankle Circles/Pumps: AROM;Both;20 reps;Seated   Assessment/Plan    PT Assessment Patient needs continued PT services  PT Problem List Decreased range of motion;Decreased strength;Decreased activity tolerance;Decreased balance;Decreased mobility;Decreased knowledge of use of DME;Pain;Decreased knowledge of precautions;Decreased safety awareness       PT Treatment Interventions DME instruction;Gait training;Stair training;Functional mobility training;Therapeutic activities;Therapeutic exercise;Balance training;Patient/family education    PT Goals (Current goals can be found in the Care Plan section)  Acute Rehab PT Goals Patient Stated Goal: get home PT Goal Formulation: With patient Time For Goal Achievement: 08/20/19 Potential to Achieve Goals: Good    Frequency 7X/week    AM-PAC PT "6 Clicks" Mobility  Outcome Measure Help needed turning from your back to your side while in a flat bed without using bedrails?: A Little Help needed moving from lying on your back to sitting on the side of a flat bed without using bedrails?: A Little Help needed moving to and from a bed to a chair (including a wheelchair)?: A Little Help needed standing up from a chair using your arms (e.g., wheelchair or bedside chair)?: A Little Help needed to walk in hospital room?: A Little Help needed climbing 3-5 steps with a railing? : A  Little 6 Click Score: 18    End of Session Equipment Utilized During Treatment: Gait belt Activity Tolerance: Patient tolerated treatment well Patient left: in chair;with call bell/phone within reach;with chair alarm set Nurse Communication: Mobility status PT Visit Diagnosis: Muscle weakness (generalized) (M62.81);Difficulty in walking, not elsewhere classified (R26.2)    Time: [id number] PT Time Calculation (min) (ACUTE ONLY): 32 min   Charges:   PT Evaluation $PT Eval Low Complexity: 1 Low PT Treatments $Gait Training: 8-22 mins       Verner Mould, DPT Physical Therapist with [facility] [phone]  08/13/2019 3:53 PM

## 2019-08-14 DIAGNOSIS — M1611 Unilateral primary osteoarthritis, right hip: Secondary | ICD-10-CM | POA: Diagnosis not present

## 2019-08-14 LAB — CBC
HCT: 39 % (ref 39.0–52.0)
Hemoglobin: 13 g/dL (ref 13.0–17.0)
MCH: 30.4 pg (ref 26.0–34.0)
MCHC: 33.3 g/dL (ref 30.0–36.0)
MCV: 91.3 fL (ref 80.0–100.0)
Platelets: 285 10*3/uL (ref 150–400)
RBC: 4.27 MIL/uL (ref 4.22–5.81)
RDW: 13.3 % (ref 11.5–15.5)
WBC: 15.2 10*3/uL — ABNORMAL HIGH (ref 4.0–10.5)
nRBC: 0 % (ref 0.0–0.2)

## 2019-08-14 LAB — BASIC METABOLIC PANEL
Anion gap: 9 (ref 5–15)
BUN: 13 mg/dL (ref 8–23)
CO2: 22 mmol/L (ref 22–32)
Calcium: 8.2 mg/dL — ABNORMAL LOW (ref 8.9–10.3)
Chloride: 100 mmol/L (ref 98–111)
Creatinine, Ser: 0.76 mg/dL (ref 0.61–1.24)
GFR calc Af Amer: >60 mL/min (ref >60)
GFR calc non Af Amer: >60 mL/min (ref >60)
Glucose, Bld: 126 mg/dL — ABNORMAL HIGH (ref 70–99)
Potassium: 4.5 mmol/L (ref 3.5–5.1)
Sodium: 131 mmol/L — ABNORMAL LOW (ref 135–145)

## 2019-08-14 MED ORDER — SODIUM CHLORIDE 0.9 % IV BOLUS
500.0000 mL | Freq: Once | INTRAVENOUS | Status: AC
  Administered 2019-08-14: 500 mL via INTRAVENOUS

## 2019-08-14 NOTE — Plan of Care (Signed)
  Problem: Education: Goal: Knowledge of the prescribed therapeutic regimen will improve Outcome: Completed/Met  All discharge instructions were given, all questions were answered.

## 2019-08-14 NOTE — TOC Progression Note (Signed)
Transition of Care [facility]) - Progression Note    Patient Details  Name: TEVEN HEILEMAN MRN: [id number] Date of Birth: [birth year]  Transition of [facility]) CM/SW Contact  Joaquin Courts, RN Phone Number: 08/14/2019, 11:26 AM  Clinical Narrative:    [employer] to deliver 3in1 to bedside for home use. [facility] will provide HHPT.   Expected Discharge Plan: [place] Barriers to Discharge: No Barriers Identified  Expected Discharge Plan and Services Expected Discharge Plan: [place]   Discharge Planning Services: CM Consult Post Acute Care Choice: [place] arrangements for the past 2 months: Single Family Home Expected Discharge Date: 08/14/19               DME Arranged: 3-N-1 DME Agency: [employer]       HH Arranged: PT HH Agency: [facility] (formerly [employer])     Representative spoke with at [place]: pre-arranged in MD office   Social Determinants of Health (SDOH) Interventions    Readmission Risk Interventions No flowsheet data found.

## 2019-08-14 NOTE — Progress Notes (Signed)
Physical Therapy Treatment Patient Details Name: Nathan Porter MRN: [id number] DOB: 1955-08-31 Today's Date: 08/14/2019    History of Present Illness Patient is 64 y.o. male s/p Rt THA posterior approach with PMH significant for HTN, HLD, COPD, OA.    PT Comments    Pt progressing well; will see for second session to review HEP. Pt amb the entire unit and a flight of stairs earlier with Kirstin, PA. Discussed not over doing it with pt given he has done quite a bit of activity today thus far.  Follow Up Recommendations  Follow surgeon's recommendation for DC plan and follow-up therapies;Home health PT     Equipment Recommendations  None recommended by PT    Recommendations for Other Services       Precautions / Restrictions Precautions Precautions: Fall Required Braces or Orthoses: Knee Immobilizer - Right Knee Immobilizer - Right: Other (comment)(posterior hip precatuions) Restrictions Weight Bearing Restrictions: No    Mobility  Bed Mobility               General bed mobility comments: OOB in chair   Transfers Overall transfer level: Needs assistance Equipment used: Rolling walker (2 wheeled) Transfers: Sit to/from Stand Sit to Stand: Supervision;Min guard         General transfer comment: cues for hand placement   Ambulation/Gait Ambulation/Gait assistance: Min guard;Supervision Gait Distance (Feet): 180 Feet Assistive device: Rolling walker (2 wheeled) Gait Pattern/deviations: Step-to pattern;Decreased weight shift to right     General Gait Details: cues for sequence, avoiding hip IR THP with turns   Stairs             Wheelchair Mobility    Modified Rankin (Stroke Patients Only)       Balance Overall balance assessment: Needs assistance Sitting-balance support: Feet supported Sitting balance-Leahy Scale: Fair     Standing balance support: During functional activity;Bilateral upper extremity supported Standing balance-Leahy  Scale: Poor                              Cognition Arousal/Alertness: Awake/alert Behavior During Therapy: WFL for tasks assessed/performed Overall Cognitive Status: Within Functional Limits for tasks assessed                                        Exercises Total Joint Exercises Ankle Circles/Pumps: AROM;Both;20 reps;Seated    General Comments        Pertinent Vitals/Pain Pain Assessment: 0-10 Pain Score: 5  Pain Location: Rt hip Pain Descriptors / Indicators: Aching;Burning;Discomfort Pain Intervention(s): Limited activity within patient's tolerance;Monitored during session    Home Living                      Prior Function            PT Goals (current goals can now be found in the care plan section) Acute Rehab PT Goals Patient Stated Goal: get home PT Goal Formulation: With patient Time For Goal Achievement: 08/20/19 Potential to Achieve Goals: Good Progress towards PT goals: Progressing toward goals    Frequency    7X/week      PT Plan      Co-evaluation              AM-PAC PT "6 Clicks" Mobility   Outcome Measure  Help needed turning from your back to your  side while in a flat bed without using bedrails?: A Little Help needed moving from lying on your back to sitting on the side of a flat bed without using bedrails?: A Little Help needed moving to and from a bed to a chair (including a wheelchair)?: A Little Help needed standing up from a chair using your arms (e.g., wheelchair or bedside chair)?: A Little Help needed to walk in hospital room?: A Little Help needed climbing 3-5 steps with a railing? : A Little 6 Click Score: 18    End of Session Equipment Utilized During Treatment: Gait belt Activity Tolerance: Patient tolerated treatment well Patient left: in chair;with call bell/phone within reach;with chair alarm set Nurse Communication: Mobility status PT Visit Diagnosis: Muscle weakness  (generalized) (M62.81);Difficulty in walking, not elsewhere classified (R26.2)     Time: [id number] PT Time Calculation (min) (ACUTE ONLY): 12 min  Charges:  $Gait Training: 8-22 mins                     Baxter Flattery, PT   Acute Rehab Dept [facility]): [id number]   08/14/2019    [facility] 08/14/2019, 10:52 AM

## 2019-08-14 NOTE — Discharge Summary (Signed)
Patient ID: Nathan Porter MRN: [id number] DOB/AGE: September 22, 1955 64 y.o.  Admit date: 08/13/2019 Discharge date: 08/14/2019  Admission Diagnoses:  Active Problems:   Degenerative joint disease of right hip   Discharge Diagnoses:  Same  Past Medical History:  Diagnosis Date  . Arthritis   . Cancer ([place]) 2019   skin  . COPD (chronic obstructive pulmonary disease) ([place]) 2020  . Hip discomfort   . History of kidney stones   . Hyperlipidemia   . Hypertension     Surgeries: Procedure(s): TOTAL HIP ARTHROPLASTY on 08/13/2019   Consultants:   Discharged Condition: Improved  Hospital Course: BRANDDON HALLIWELL is an 64 y.o. male who was admitted 08/13/2019 for operative treatment of<principal problem not specified>. Patient has severe unremitting pain that affects sleep, daily activities, and work/hobbies. After pre-op clearance the patient was taken to the operating room on 08/13/2019 and underwent  Procedure(s): TOTAL HIP ARTHROPLASTY.    Patient was given perioperative antibiotics:  Anti-infectives (From admission, onward)   Start     Dose/Rate Route Frequency Ordered Stop   08/13/19 1400  ceFAZolin (ANCEF) IVPB 1 g/50 mL premix     1 g 100 mL/hr over 30 Minutes Intravenous Every 6 hours 08/13/19 1105 08/13/19 2117   08/13/19 0600  ceFAZolin (ANCEF) IVPB 2g/100 mL premix     2 g 200 mL/hr over 30 Minutes Intravenous On call to O.R. 08/13/19 0532 08/13/19 0743       Patient was given sequential compression devices, early ambulation, and chemoprophylaxis to prevent DVT. Post op day zero patient had difficulty with hypotension so was unable to discharge.  Post op day 1 still some dizziness  Given a fluid bolus.  Walked well with me for a lap around the floor and a flight of stairs.  Will go home after PT today   Patient benefited maximally from hospital stay and there were no complications.    Recent vital signs:  Patient Vitals for the past 24 hrs:  BP Temp Temp src Pulse Resp  SpO2  08/14/19 0609 (!) 141/79 97.9 F (36.6 C) -- 91 17 96 %  08/14/19 0147 (!) 159/82 97.8 F (36.6 C) Oral 87 17 97 %  08/13/19 2054 (!) 153/80 98 F (36.7 C) Oral 87 18 93 %  08/13/19 1755 (!) 143/89 98.2 F (36.8 C) Oral 79 20 98 %  08/13/19 1403 135/81 97.7 F (36.5 C) -- 84 18 96 %  08/13/19 1256 121/75 97.7 F (36.5 C) -- 89 19 96 %  08/13/19 1207 128/75 97.6 F (36.4 C) Oral 82 17 96 %  08/13/19 1102 128/75 (!) 97.5 F (36.4 C) -- 76 -- 95 %  08/13/19 1045 104/73 (!) 97.4 F (36.3 C) -- 71 16 93 %  08/13/19 1030 128/71 (!) 97.4 F (36.3 C) -- 78 12 93 %  08/13/19 1025 123/72 -- -- 79 17 94 %  08/13/19 1020 120/76 -- -- 77 16 93 %  08/13/19 1015 111/67 -- -- 81 18 94 %  08/13/19 1010 115/70 -- -- 80 18 100 %  08/13/19 1005 122/65 -- -- 82 12 99 %  08/13/19 1000 121/72 -- -- 88 17 99 %  08/13/19 0956 132/77 (!) 96.6 F (35.9 C) -- 85 15 98 %     Recent laboratory studies:  Recent Labs    08/14/19 0314  WBC 15.2*  HGB 13.0  HCT 39.0  PLT 285  NA 131*  K 4.5  CL 100  CO2 22  BUN 13  CREATININE 0.76  GLUCOSE 126*  CALCIUM 8.2*     Discharge Medications:   Allergies as of 08/14/2019   No Known Allergies     Medication List    TAKE these medications   3-in-1 Bedside Toilet Misc Dx R total hip   amLODipine 5 MG tablet Commonly known as: NORVASC Take 1 tablet (5 mg total) by mouth daily. What changed: when to take this   aspirin EC 81 MG tablet Take 1 tablet (81 mg total) by mouth 2 (two) times daily. TO PREVENT BLOOD CLOTS   buPROPion 150 MG 24 hr tablet Commonly known as: WELLBUTRIN XL Take 150 mg by mouth daily.   HYDROcodone-acetaminophen 5-325 MG tablet Commonly known as: NORCO/VICODIN Take 1 tab po q4-6hrs prn pain, may need 1-2 first couple weeks on occasion, max 8 tabs per day.   Nasacort Allergy 24HR 55 MCG/ACT Aero nasal inhaler Generic drug: triamcinolone Place 2 sprays into the nose daily.   polycarbophil 625 MG  tablet Commonly known as: FIBERCON Take 1,250 mg by mouth daily.   promethazine 25 MG tablet Commonly known as: PHENERGAN Take 25 mg by mouth 2 (two) times daily as needed (nausea associated with kidney stones.).   rosuvastatin 20 MG tablet Commonly known as: CRESTOR Take 20 mg by mouth at bedtime.   tamsulosin 0.4 MG Caps capsule Commonly known as: FLOMAX Take 2 capsules by mouth at bedtime.   traMADol 50 MG tablet Commonly known as: ULTRAM Take 50 mg by mouth 2 (two) times daily as needed (pain associated with kidney stones.).       Diagnostic Studies: DG Pelvis Portable  Result Date: 08/13/2019 CLINICAL DATA:  Status post EXAM: PORTABLE PELVIS AND RIGHT HIP: 1-2 V COMPARISON:  None. FINDINGS: Patient is status post total hip replacement on the right with prosthetic components appearing well-seated on frontal view. No acute fracture or dislocation. There is moderate narrowing of the left hip joint. IMPRESSION: Status post total hip replacement on the right with prosthetic components well-seated on frontal view. No fracture or dislocation. Narrowing left hip joint. Electronically Signed   By: Lowella Grip III M.D.   On: 08/13/2019 10:38   DG Hip Port Unilat With Pelvis 1V Right  Result Date: 08/13/2019 CLINICAL DATA:  Status post EXAM: PORTABLE PELVIS AND RIGHT HIP: 1-2 V COMPARISON:  None. FINDINGS: Patient is status post total hip replacement on the right with prosthetic components appearing well-seated on frontal view. No acute fracture or dislocation. There is moderate narrowing of the left hip joint. IMPRESSION: Status post total hip replacement on the right with prosthetic components well-seated on frontal view. No fracture or dislocation. Narrowing left hip joint. Electronically Signed   By: Lowella Grip III M.D.   On: 08/13/2019 10:38    Disposition: Discharge disposition: 01-Home or Self Care       Discharge Instructions    Diet - low sodium heart healthy    Complete by: As directed    Discharge instructions   Complete by: As directed    [place] items at home which could result in a fall. This includes throw rugs or furniture in walking pathways ICE to the affected joint every three hours while awake for 30 minutes at a time, for at least the first 3-5 days, and then as needed for pain and swelling.  Continue to use ice for pain and swelling. You may notice swelling that will progress down to the foot  and ankle.  This is normal after surgery.  Elevate your leg when you are not up walking on it.   Continue to use the breathing machine you got in the hospital (incentive spirometer) which will help keep your temperature down.  It is common for your temperature to cycle up and down following surgery, especially at night when you are not up moving around and exerting yourself.  The breathing machine keeps your lungs expanded and your temperature down.   DIET:  As you were doing prior to hospitalization, we recommend a well-balanced diet.  DRESSING / WOUND CARE / SHOWERING  Keep the surgical dressing until follow up.  The dressing is water proof, so you can shower without any extra covering.  IF THE DRESSING FALLS OFF or the wound gets wet inside, change the dressing with sterile gauze.  Please use good hand washing techniques before changing the dressing.  Do not use any lotions or creams on the incision until instructed by your surgeon.    ACTIVITY  Increase activity slowly as tolerated, but follow the weight bearing instructions below.   No driving for 6 weeks or until further direction given by your physician.  You cannot drive while taking narcotics.  No lifting or carrying greater than 10 lbs. until further directed by your surgeon. Avoid periods of inactivity such as sitting longer than an hour when not asleep. This helps prevent blood clots.  You may return to work once you are authorized by your doctor.      WEIGHT BEARING   Weight bearing as tolerated with assist device (walker, cane, etc) as directed, use it as long as suggested by your surgeon or therapist, typically at least 4-6 weeks.   EXERCISES  Results after joint replacement surgery are often greatly improved when you follow the exercise, range of motion and muscle strengthening exercises prescribed by your doctor. Safety measures are also important to protect the joint from further injury. Any time any of these exercises cause you to have increased pain or swelling, decrease what you are doing until you are comfortable again and then slowly increase them. If you have problems or questions, call your caregiver or physical therapist for advice.   Rehabilitation is important following a joint replacement. After just a few days of immobilization, the muscles of the leg can become weakened and shrink (atrophy).  These exercises are designed to build up the tone and strength of the thigh and leg muscles and to improve motion. Often times heat used for twenty to thirty minutes before working out will loosen up your tissues and help with improving the range of motion but do not use heat for the first two weeks following surgery (sometimes heat can increase post-operative swelling).   These exercises can be done on a training (exercise) mat, on the floor, on a table or on a bed. Use whatever works the best and is most comfortable for you.    Use music or television while you are exercising so that the exercises are a pleasant break in your day. This will make your life better with the exercises acting as a break in your routine that you can look forward to.   Perform all exercises about fifteen times, three times per day or as directed.  You should exercise both the operative leg and the other leg as well.   Exercises include:   Quad Sets - Tighten up the muscle on the front of the thigh (Quad) and hold for 5-10  seconds.   Straight Leg Raises -  With your knee straight (if you were given a brace, keep it on), lift the leg to 60 degrees, hold for 3 seconds, and slowly lower the leg.  Perform this exercise against resistance later as your leg gets stronger.  Leg Slides: Lying on your back, slowly slide your foot toward your buttocks, bending your knee up off the floor (only go as far as is comfortable). Then slowly slide your foot back down until your leg is flat on the floor again.  Angel Wings: Lying on your back spread your legs to the side as far apart as you can without causing discomfort.  Hamstring Strength:  Lying on your back, push your heel against the floor with your leg straight by tightening up the muscles of your buttocks.  Repeat, but this time bend your knee to a comfortable angle, and push your heel against the floor.  You may put a pillow under the heel to make it more comfortable if necessary.   A rehabilitation program following joint replacement surgery can speed recovery and prevent re-injury in the future due to weakened muscles. Contact your doctor or a physical therapist for more information on knee rehabilitation.    CONSTIPATION  Constipation is defined medically as fewer than three stools per week and severe constipation as less than one stool per week.  Even if you have a regular bowel pattern at home, your normal regimen is likely to be disrupted due to multiple reasons following surgery.  Combination of anesthesia, postoperative narcotics, change in appetite and fluid intake all can affect your bowels.   YOU MUST use at least one of the following options; they are listed in order of increasing strength to get the job done.  They are all available over the counter, and you may need to use some, POSSIBLY even all of these options:    Drink plenty of fluids (prune juice may be helpful) and high fiber foods Colace 100 mg by mouth twice a day  Senokot for constipation as directed and as needed Dulcolax (bisacodyl),  take with full glass of water  Miralax (polyethylene glycol) once or twice a day as needed.  If you have tried all these things and are unable to have a bowel movement in the first 3-4 days after surgery call either your surgeon or your primary doctor.    If you experience loose stools or diarrhea, hold the medications until you stool forms back up.  If your symptoms do not get better within 1 week or if they get worse, check with your doctor.  If you experience "the worst abdominal pain ever" or develop nausea or vomiting, please contact the office immediately for further recommendations for treatment.   ITCHING:  If you experience itching with your medications, try taking only a single pain pill, or even half a pain pill at a time.  You can also use Benadryl over the counter for itching or also to help with sleep.   TED HOSE STOCKINGS:  Use stockings on both legs until for at least 2 weeks or as directed by physician office. They may be removed at night for sleeping.  MEDICATIONS:  See your medication summary on the "After Visit Summary" that nursing will review with you.  You may have some home medications which will be placed on hold until you complete the course of blood thinner medication.  It is important for you to complete the blood thinner medication as prescribed.  PRECAUTIONS:  If you experience chest pain or shortness of breath - call 911 immediately for transfer to the hospital emergency department.   If you develop a fever greater that 101 F, purulent drainage from wound, increased redness or drainage from wound, foul odor from the wound/dressing, or calf pain - CONTACT YOUR SURGEON.                                                   FOLLOW-UP APPOINTMENTS:  If you do not already have a post-op appointment, please call the office for an appointment to be seen by your surgeon.  Guidelines for how soon to be seen are listed in your "After Visit Summary", but are typically between 1-4  weeks after surgery.  OTHER INSTRUCTIONS:     DENTAL ANTIBIOTICS:  In most cases prophylactic antibiotics for Dental procdeures after total joint surgery are not necessary.  Exceptions are as follows:  1. History of prior total joint infection  2. Severely immunocompromised (Organ Transplant, cancer chemotherapy, Rheumatoid biologic meds such as Holly)  3. Poorly controlled diabetes (A1C > 8.0, blood glucose over 200)  If you have one of these conditions, contact your surgeon for an antibiotic prescription, prior to your dental procedure.   MAKE SURE YOU:  Understand these instructions.  Get help right away if you are not doing well or get worse.    Thank you for letting us be a part of your medical care team.  It is a privilege we respect greatly.  We hope these instructions will help you stay on track for a fast and full recovery!   Increase activity slowly   Complete by: As directed      Post op day zero patient had difficulty with hypotension so was unable to discharge.  Post op day 1 still some dizziness  Given a fluid bolus.  Walked well with me for a lap around the floor and a flight of stairs.  Will go home after PT today   Follow-up Information    Earlie Server, MD. Schedule an appointment as soon as possible for a visit in 2 weeks.   Specialty: Orthopedic Surgery Contact information: [address] [address] [phone]        [facility] up.   Specialty: [place] Why: to provide home health physical therapy  Contact information: [address] [place] [address] [phone]            Signed: Linda Hedges 08/14/2019, 8:12 AM

## 2019-08-14 NOTE — Progress Notes (Signed)
Physical Therapy Treatment Patient Details Name: Nathan Porter MRN: [id number] DOB: 1955-06-07 Today's Date: 08/14/2019    History of Present Illness Patient is 64 y.o. male s/p Rt THA posterior approach with PMH significant for HTN, HLD, COPD, OA.    PT Comments    Pt progressing well. Exercise focused session. Reviewed THP.  Pt tolerated well. Ready for d/c from PT standpoint.   Follow Up Recommendations  Follow surgeon's recommendation for DC plan and follow-up therapies;Home health PT     Equipment Recommendations  None recommended by PT    Recommendations for Other Services       Precautions / Restrictions Precautions Precautions: Fall Required Braces or Orthoses: Knee Immobilizer - Right Knee Immobilizer - Right: Other (comment)(posterior hip precatuions) Restrictions Weight Bearing Restrictions: No    Mobility  Bed Mobility               General bed mobility comments: OOB in chair   Transfers Overall transfer level: Needs assistance Equipment used: Rolling walker (2 wheeled) Transfers: Sit to/from Stand Sit to Stand: Supervision;Min guard         General transfer comment: cues for hand placement   Ambulation/Gait Ambulation/Gait assistance: Min guard;Supervision Gait Distance (Feet): 180 Feet Assistive device: Rolling walker (2 wheeled) Gait Pattern/deviations: Step-to pattern;Decreased weight shift to right     General Gait Details: cues for sequence, avoiding hip IR THP with turns   Stairs             Wheelchair Mobility    Modified Rankin (Stroke Patients Only)       Balance Overall balance assessment: Needs assistance Sitting-balance support: Feet supported Sitting balance-Leahy Scale: Fair     Standing balance support: During functional activity;Bilateral upper extremity supported Standing balance-Leahy Scale: Poor                              Cognition Arousal/Alertness: Awake/alert Behavior During  Therapy: WFL for tasks assessed/performed Overall Cognitive Status: Within Functional Limits for tasks assessed                                        Exercises Total Joint Exercises Ankle Circles/Pumps: AROM;Both;20 reps;Seated Quad Sets: AROM;10 reps;Both Short Arc Quad: AROM;Right;10 reps Heel Slides: AROM;AAROM;Right;10 reps Hip ABduction/ADduction: AROM;Right;10 reps Long Arc Quad: AROM;Right;10 reps    General Comments        Pertinent Vitals/Pain Pain Assessment: 0-10 Pain Score: 4  Pain Location: Rt hip Pain Descriptors / Indicators: Aching;Burning;Discomfort Pain Intervention(s): Limited activity within patient's tolerance;Monitored during session;Premedicated before session    Home Living                      Prior Function            PT Goals (current goals can now be found in the care plan section) Acute Rehab PT Goals Patient Stated Goal: get home PT Goal Formulation: With patient Time For Goal Achievement: 08/20/19 Potential to Achieve Goals: Good Progress towards PT goals: Progressing toward goals    Frequency    7X/week      PT Plan Current plan remains appropriate    Co-evaluation              AM-PAC PT "6 Clicks" Mobility   Outcome Measure  Help needed turning from your back to  your side while in a flat bed without using bedrails?: A Little Help needed moving from lying on your back to sitting on the side of a flat bed without using bedrails?: A Little Help needed moving to and from a bed to a chair (including a wheelchair)?: A Little Help needed standing up from a chair using your arms (e.g., wheelchair or bedside chair)?: A Little Help needed to walk in hospital room?: A Little Help needed climbing 3-5 steps with a railing? : A Little 6 Click Score: 18    End of Session Equipment Utilized During Treatment: Gait belt Activity Tolerance: Patient tolerated treatment well Patient left: in chair;with call  bell/phone within reach;with chair alarm set Nurse Communication: Mobility status PT Visit Diagnosis: Muscle weakness (generalized) (M62.81);Difficulty in walking, not elsewhere classified (R26.2)     Time: [id number] PT Time Calculation (min) (ACUTE ONLY): 15 min  Charges:  $Gait Training: 8-22 mins $Therapeutic Exercise: 8-22 mins                     Baxter Flattery, PT   Acute Rehab Dept [facility]): [id number]   08/14/2019    [facility] 08/14/2019, 1:45 PM

## 2019-08-17 ENCOUNTER — Encounter: Payer: Self-pay | Admitting: *Deleted

## 2019-09-13 ENCOUNTER — Encounter: Payer: Self-pay | Admitting: Cardiology

## 2019-09-13 ENCOUNTER — Ambulatory Visit: Payer: Medicare Other | Admitting: Cardiology

## 2019-09-13 ENCOUNTER — Other Ambulatory Visit: Payer: Self-pay

## 2019-09-13 VITALS — BP 136/75 | HR 84 | Resp 16 | Ht 69.0 in | Wt 199.0 lb

## 2019-09-13 DIAGNOSIS — Z720 Tobacco use: Secondary | ICD-10-CM

## 2019-09-13 DIAGNOSIS — I700 Atherosclerosis of aorta: Secondary | ICD-10-CM

## 2019-09-13 DIAGNOSIS — I2510 Atherosclerotic heart disease of native coronary artery without angina pectoris: Secondary | ICD-10-CM

## 2019-09-13 DIAGNOSIS — E7800 Pure hypercholesterolemia, unspecified: Secondary | ICD-10-CM

## 2019-09-13 DIAGNOSIS — I10 Essential (primary) hypertension: Secondary | ICD-10-CM

## 2019-09-13 NOTE — Progress Notes (Signed)
Primary Physician/Referring:  Crist Infante, MD  Patient ID: [name], male    DOB: Aug 06, 1955, 64 y.o.   MRN: [id number]  Chief Complaint  Patient presents with  . Coronary Atherosclerosis  . Follow-up    6 month   HPI:    LAVONTA TILLIS  is a 64 y.o. Caucasian male with hypertension, hyperlipidemia, aortic and coronary atherosclerosis without angina noted on the CT scan for lung cancer screening in August 2020, tobacco use disorder presents here for 50-monthoffice visit for management of his risk factors.  Patient has reduced his smoking to 1/2 pack of cigarettes a day.  He underwent right hip replacement in June 2021 and has recuperated well.  He has not had any periprocedural cardiac complications.  He is presently on Wellbutrin but is still having difficulty in smoking cessation.  Past Medical History:  Diagnosis Date  . Arthritis   . Cancer ([facility] 2019   skin  . COPD (chronic obstructive pulmonary disease) ([place] 2020  . Hip discomfort   . History of kidney stones   . Hyperlipidemia   . Hypertension    Past Surgical History:  Procedure Laterality Date  . CYSTOSCOPY    . HEMORRHOID BANDING  2019  . LITHOTRIPSY    . LYMPHADENECTOMY  03/2018  . TOTAL HIP ARTHROPLASTY Right 08/13/2019   Procedure: TOTAL HIP ARTHROPLASTY;  Surgeon: CEarlie Server MD;  Location: [facility];  Service: Orthopedics;  Laterality: Right;   Family History  Problem Relation Age of Onset  . Lung disease Mother        Tumor  . CAD Father        S/pCABG    Social History   Tobacco Use  . Smoking status: Current Every Day Smoker    Packs/day: 1.00    Years: 45.00    Pack years: 45.00  . Smokeless tobacco: Never Used  Substance Use Topics  . Alcohol use: Not Currently   Marital Status: Married  ROS  Review of Systems  Cardiovascular: Negative for chest pain, dyspnea on exertion and leg swelling.  Gastrointestinal: Negative for melena.   Objective  Blood pressure 136/75, pulse 84,  resp. rate 16, height _0  (1.753 m), weight 199 lb (90.3 kg), SpO2 94 %.  Vitals with BMI 09/13/2019 08/14/2019 08/14/2019  Height _1  - -  Weight 199 lbs - -  BMI 288.41- -  Systolic [id number] Diastolic 75 79 82  Pulse 84 91 87     Physical Exam Cardiovascular:     Rate and Rhythm: Normal rate and regular rhythm.     Pulses: Intact distal pulses.     Heart sounds: Normal heart sounds. No murmur heard.  No gallop.      Comments: No leg edema, no JVD. Pulmonary:     Effort: Pulmonary effort is normal.     Breath sounds: Normal breath sounds.  Abdominal:     General: Bowel sounds are normal.     Palpations: Abdomen is soft.    Laboratory examination:   Recent Labs    08/06/19 1539 08/14/19 0314  NA 139 131*  K 4.3 4.5  CL 107 100  CO2 26 22  GLUCOSE 120* 126*  BUN 27* 13  CREATININE 1.16 0.76  CALCIUM 8.8* 8.2*  GFRNONAA >60 >60  GFRAA >60 >60   CrCl cannot be calculated (Patient's most recent lab result is older than the maximum 21 days allowed.).  CMP Latest Ref Rng &  Units 08/14/2019 08/06/2019  Glucose 70 - 99 mg/dL 126(H) 120(H)  BUN 8 - 23 mg/dL 13 27(H)  Creatinine 0.61 - 1.24 mg/dL 0.76 1.16  Sodium 135 - 145 mmol/L 131(L) 139  Potassium 3.5 - 5.1 mmol/L 4.5 4.3  Chloride 98 - 111 mmol/L 100 107  CO2 22 - 32 mmol/L 22 26  Calcium 8.9 - 10.3 mg/dL 8.2(L) 8.8(L)  Total Protein 6.5 - 8.1 g/dL - 6.6  Total Bilirubin 0.3 - 1.2 mg/dL - 0.4  Alkaline Phos 38 - 126 U/L - 75  AST 15 - 41 U/L - 16  ALT 0 - 44 U/L - 22   CBC Latest Ref Rng & Units 08/14/2019 08/06/2019  WBC 4.0 - 10.5 K/uL 15.2(H) 8.9  Hemoglobin 13.0 - 17.0 g/dL 13.0 14.5  Hematocrit 39 - 52 % 39.0 44.0  Platelets 150 - 400 K/uL 285 287    Lipid Panel Lipid Panel No results for input(s): CHOL, TRIG, LDLCALC, VLDL, HDL, CHOLHDL, LDLDIRECT in the last 8760 hours.  HEMOGLOBIN A1C No results found for: HGBA1C, MPG TSH No results for input(s): TSH in the last 8760 hours.  External labs:    Cholesterol, total 129.000 m 04/12/2019 Triglycerides 62.000 04/12/2019 HDL 40 MG/DL 04/12/2019 LDL 77.000 mg 04/12/2019  Hemoglobin 13.000 08/14/2019 Creatinine, Serum 0.760 08/14/2019 Potassium 4.500 08/14/2019  10/01/2018: Glucose 93. BUN/Cr 19/1.0. eGFR 75. Na/K 141/4.8 H/H 16/49. MCV 92. Plateelts 274. Chol 190, TG 99, HDL 39, LDL 131. LDL-P 1348 nmol/L (<1000) HDL-P 26.6 nmol/L (>30.5)  Medications and allergies  No Known Allergies   Current Outpatient Medications  Medication Instructions  . amLODipine (NORVASC) 5 mg, Oral, Daily  . aspirin EC 81 mg, Oral, Daily, Swallow whole.   Marland Kitchen buPROPion (WELLBUTRIN XL) 150 mg, Oral, Daily  . HYDROcodone-acetaminophen (NORCO/VICODIN) 5-325 MG tablet Take 1 tab po q4-6hrs prn pain, may need 1-2 first couple weeks on occasion, max 8 tabs per day.  . polycarbophil (FIBERCON) 1,250 mg, Oral, Daily  . promethazine (PHENERGAN) 25 mg, 2 times daily PRN  . rosuvastatin (CRESTOR) 20 mg, Oral, Daily at bedtime  . tamsulosin (FLOMAX) 0.4 MG CAPS capsule 2 capsules, Oral, Daily at bedtime  . traMADol (ULTRAM) 50 mg, 2 times daily PRN  . triamcinolone (NASACORT ALLERGY 24HR) 55 MCG/ACT AERO nasal inhaler 2 sprays, Nasal, Daily    Radiology:   CT chest 11/10/2018: 1. Lung-RADS 2S, benign appearance or behavior. Continue annual screening with low-dose chest CT without contrast in 12 months. 2. The "S" modifier above refers to potentially clinically significant non lung cancer related findings. Specifically, there is aortic atherosclerosis, in addition to left main and 3 vessel coronary artery disease. Please note that although the presence of coronary artery calcium documents the presence of coronary artery disease, the severity of this disease and any potential stenosis cannot be assessed on this non-gated CT examination. Assessment for potential risk factor modification, dietary therapy or pharmacologic therapy may be warranted, if clinically  indicated. 3. Mild diffuse bronchial wall thickening with mild to moderate centrilobular and mild paraseptal emphysema; imaging findings suggestive of underlying COPD.  Aortic Atherosclerosis (ICD10-I70.0) and Emphysema (ICD10-J43.9).  Cardiac Studies:   Abdominal Aortic Duplex  01/08/2019: No AAA observed.  The maximum aorta (sac) diameter is 2.24 cm (mid). Diffuse plaque observed in the mid aorta.  Mild increase in left internal iliac artery velocity and suggests <50% stenosis, with normal triphasic wave pattern.   EKG 12/14/2018: Sinus rhythm 79 bpm.  Borderline left atrial enlargement. Early R  wave transition. Nonspecific ST depression anteroseptal leads.  EKG  EKG 09/13/2019: Normal sinus rhythm with rate of 83 bpm, normal axis.  Incomplete right bundle branch block.  No evidence of ischemia.  Normal EKG.      Assessment     ICD-10-CM   1. Coronary artery calcification seen on CAT scan  I25.10 EKG 12-Lead  2. Aortic atherosclerosis (HCC)  I70.0   3. Hypercholesteremia  E78.00   4. Essential hypertension  I10   5. Tobacco abuse  Z72.0      Medications Discontinued During This Encounter  Medication Reason  . Misc. Devices (3-IN-1 BEDSIDE TOILET) MISC No longer needed (for PRN medications)     Recommendations:   MASAYOSHI COUZENS  is a  64 y.o. Caucasian male with hypertension, hyperlipidemia, aortic and coronary atherosclerosis without angina noted on the CT scan for lung cancer screening in August 2020, tobacco use disorder presents here for 62-monthoffice visit for management of his risk factors.  I reviewed the results of the recently performed labs by his PCP in January, LDL is still >70 mg percent, however patient has lost about 10 pounds in weight and has been eating healthier.  He would like to follow-up with me again in 6 months after he has had a second physical examination in 2022 with Dr. PJoylene Draftand follow-up with me and if he has remained stable and his LDL is  at goal, no chest pain, he will be released back to management by his PCP regarding his cardiovascular risk factors.  Blood pressure is well controlled.  His only ongoing risk factor presently is very mild elevation in his LDL and also continued tobacco use disorder.  Although he is presently on Wellbutrin, he has really not made up his mind in quitting smoking, advised him to fix a date on August [birth year] as it [birth year] his birthday and also use nicotine patches. Additional 4 mint on counseling.  He underwent right hip replacement a month ago in June 2021, without periprocedural cardiac complication.  Although he has coronary calcification noted on the CT scan, as he remains asymptomatic and continues to be fairly active in spite of arthritis, no stress test is being performed.  I will see him back in 6 months.   JAdrian Prows MD, [facility]6/28/2021, 9:43 AM Office: [phone]

## 2019-10-19 ENCOUNTER — Other Ambulatory Visit: Payer: Self-pay | Admitting: Cardiology

## 2019-10-19 DIAGNOSIS — I10 Essential (primary) hypertension: Secondary | ICD-10-CM

## 2019-12-01 ENCOUNTER — Other Ambulatory Visit: Payer: Self-pay

## 2019-12-01 ENCOUNTER — Ambulatory Visit (INDEPENDENT_AMBULATORY_CARE_PROVIDER_SITE_OTHER): Payer: Medicare Other | Admitting: Physician Assistant

## 2019-12-01 DIAGNOSIS — D485 Neoplasm of uncertain behavior of skin: Secondary | ICD-10-CM

## 2019-12-01 DIAGNOSIS — Z1283 Encounter for screening for malignant neoplasm of skin: Secondary | ICD-10-CM

## 2019-12-01 DIAGNOSIS — Z86007 Personal history of in-situ neoplasm of skin: Secondary | ICD-10-CM

## 2019-12-01 DIAGNOSIS — I2510 Atherosclerotic heart disease of native coronary artery without angina pectoris: Secondary | ICD-10-CM

## 2019-12-01 DIAGNOSIS — L570 Actinic keratosis: Secondary | ICD-10-CM

## 2019-12-01 DIAGNOSIS — D044 Carcinoma in situ of skin of scalp and neck: Secondary | ICD-10-CM

## 2019-12-01 DIAGNOSIS — Z85828 Personal history of other malignant neoplasm of skin: Secondary | ICD-10-CM | POA: Diagnosis not present

## 2019-12-01 DIAGNOSIS — C4492 Squamous cell carcinoma of skin, unspecified: Secondary | ICD-10-CM

## 2019-12-01 HISTORY — DX: Squamous cell carcinoma of skin, unspecified: C44.92

## 2019-12-01 NOTE — Patient Instructions (Signed)

## 2019-12-03 ENCOUNTER — Encounter: Payer: Self-pay | Admitting: Physician Assistant

## 2019-12-03 NOTE — Progress Notes (Signed)
   Follow up Visit  Subjective  Nathan Porter is a 64 y.o. male who presents for the following: Skin Problem (concerns Rough spot on right ear and mole on left cheek has changed. ). Crusting area on ear that has been there for months.   Objective  Well appearing patient in no apparent distress; mood and affect are within normal limits.  All skin waist up examined we also examined his legs.  No suspicious moles noted on back.   Objective  Mid Back: Full body skin check.  Objective  Left Superior Crus of Antihelix (2), Right Forearm - Anterior (5): Erythematous patches with gritty scale.  Objective  Right Triangular Fossa: Crusting patch      Assessment & Plan  History of basal cell carcinoma (BCC) of skin Mid Back  History of squamous cell carcinoma in situ (SCCIS) Left Forearm - Posterior  Screening for malignant neoplasm of skin Mid Back  AK (actinic keratosis) (7) Right Forearm - Anterior (5); Left Superior Crus of Antihelix (2)  Destruction of lesion - Left Superior Crus of Antihelix, Right Forearm - Anterior Complexity: simple   Destruction method: cryotherapy   Informed consent: discussed and consent obtained   Timeout:  patient name, date of birth, surgical site, and procedure verified Lesion destroyed using liquid nitrogen: Yes   Outcome: patient tolerated procedure well with no complications    Neoplasm of uncertain behavior of skin Right Triangular Fossa  Skin / nail biopsy Type of biopsy: tangential   Informed consent: discussed and consent obtained   Timeout: patient name, date of birth, surgical site, and procedure verified   Procedure prep:  Patient was prepped and draped in usual sterile fashion (Non sterile) Prep type:  Chlorhexidine Anesthesia: the lesion was anesthetized in a standard fashion   Anesthetic:  1% lidocaine w/ epinephrine 1-100,000 local infiltration Instrument used: flexible razor blade   Outcome: patient tolerated  procedure well   Post-procedure details: wound care instructions given    Specimen 1 - Surgical pathology Differential Diagnosis: scc vs bcc Check Margins: No

## 2019-12-07 ENCOUNTER — Other Ambulatory Visit: Payer: Self-pay | Admitting: Internal Medicine

## 2019-12-07 DIAGNOSIS — F17200 Nicotine dependence, unspecified, uncomplicated: Secondary | ICD-10-CM

## 2019-12-08 ENCOUNTER — Telehealth: Payer: Self-pay | Admitting: *Deleted

## 2019-12-08 ENCOUNTER — Encounter: Payer: Self-pay | Admitting: *Deleted

## 2019-12-08 NOTE — Telephone Encounter (Signed)
-----   Message from Arlyss Gandy, [place] sent at 12/06/2019  5:16 PM EDT ----- Needs 30 min appt.

## 2019-12-08 NOTE — Telephone Encounter (Signed)
Pathology to patient, surgery scheduled  

## 2019-12-08 NOTE — Telephone Encounter (Signed)
Left message for patient to call back  

## 2019-12-13 ENCOUNTER — Inpatient Hospital Stay: Admission: RE | Admit: 2019-12-13 | Payer: Medicare Other | Source: Ambulatory Visit

## 2019-12-21 ENCOUNTER — Ambulatory Visit
Admission: RE | Admit: 2019-12-21 | Discharge: 2019-12-21 | Disposition: A | Discharge status: Home / Self Care | Payer: Medicare Other | Source: Ambulatory Visit | Attending: Internal Medicine | Admitting: Internal Medicine

## 2019-12-21 DIAGNOSIS — F17200 Nicotine dependence, unspecified, uncomplicated: Secondary | ICD-10-CM

## 2020-01-13 ENCOUNTER — Other Ambulatory Visit: Payer: Self-pay

## 2020-01-13 ENCOUNTER — Ambulatory Visit (INDEPENDENT_AMBULATORY_CARE_PROVIDER_SITE_OTHER): Payer: Medicare Other | Admitting: Dermatology

## 2020-01-13 ENCOUNTER — Encounter: Payer: Self-pay | Admitting: Dermatology

## 2020-01-13 DIAGNOSIS — D0439 Carcinoma in situ of skin of other parts of face: Secondary | ICD-10-CM | POA: Diagnosis not present

## 2020-01-13 DIAGNOSIS — L821 Other seborrheic keratosis: Secondary | ICD-10-CM | POA: Diagnosis not present

## 2020-01-13 DIAGNOSIS — B078 Other viral warts: Secondary | ICD-10-CM

## 2020-01-13 DIAGNOSIS — D099 Carcinoma in situ, unspecified: Secondary | ICD-10-CM

## 2020-01-13 DIAGNOSIS — B079 Viral wart, unspecified: Secondary | ICD-10-CM

## 2020-01-13 DIAGNOSIS — D485 Neoplasm of uncertain behavior of skin: Secondary | ICD-10-CM

## 2020-01-13 NOTE — Patient Instructions (Signed)

## 2020-03-31 DIAGNOSIS — M79641 Pain in right hand: Secondary | ICD-10-CM | POA: Diagnosis not present

## 2020-03-31 DIAGNOSIS — M0579 Rheumatoid arthritis with rheumatoid factor of multiple sites without organ or systems involvement: Secondary | ICD-10-CM | POA: Diagnosis not present

## 2020-03-31 DIAGNOSIS — M150 Primary generalized (osteo)arthritis: Secondary | ICD-10-CM | POA: Diagnosis not present

## 2020-03-31 DIAGNOSIS — M79642 Pain in left hand: Secondary | ICD-10-CM | POA: Diagnosis not present

## 2020-04-17 ENCOUNTER — Other Ambulatory Visit: Payer: Self-pay | Admitting: Orthopedic Surgery

## 2020-04-17 DIAGNOSIS — M1611 Unilateral primary osteoarthritis, right hip: Secondary | ICD-10-CM | POA: Diagnosis not present

## 2020-04-17 DIAGNOSIS — M898X5 Other specified disorders of bone, thigh: Secondary | ICD-10-CM | POA: Diagnosis not present

## 2020-04-18 ENCOUNTER — Ambulatory Visit (INDEPENDENT_AMBULATORY_CARE_PROVIDER_SITE_OTHER): Payer: Medicare Other | Admitting: Dermatology

## 2020-04-18 ENCOUNTER — Other Ambulatory Visit: Payer: Self-pay

## 2020-04-18 ENCOUNTER — Encounter: Payer: Self-pay | Admitting: Dermatology

## 2020-04-18 DIAGNOSIS — D485 Neoplasm of uncertain behavior of skin: Secondary | ICD-10-CM

## 2020-04-18 DIAGNOSIS — Z85828 Personal history of other malignant neoplasm of skin: Secondary | ICD-10-CM

## 2020-04-18 DIAGNOSIS — L820 Inflamed seborrheic keratosis: Secondary | ICD-10-CM | POA: Diagnosis not present

## 2020-04-18 DIAGNOSIS — Z1283 Encounter for screening for malignant neoplasm of skin: Secondary | ICD-10-CM

## 2020-04-18 DIAGNOSIS — Z8589 Personal history of malignant neoplasm of other organs and systems: Secondary | ICD-10-CM

## 2020-04-18 DIAGNOSIS — D2239 Melanocytic nevi of other parts of face: Secondary | ICD-10-CM | POA: Diagnosis not present

## 2020-04-18 NOTE — Patient Instructions (Signed)

## 2020-04-20 ENCOUNTER — Other Ambulatory Visit (HOSPITAL_COMMUNITY): Payer: Self-pay | Admitting: Orthopedic Surgery

## 2020-04-20 ENCOUNTER — Other Ambulatory Visit: Payer: Self-pay | Admitting: Orthopedic Surgery

## 2020-04-20 DIAGNOSIS — M898X5 Other specified disorders of bone, thigh: Secondary | ICD-10-CM

## 2020-04-25 ENCOUNTER — Encounter: Payer: Self-pay | Admitting: Dermatology

## 2020-04-25 NOTE — Progress Notes (Signed)
Follow-Up Visit   Subjective  Nathan Porter is a 65 y.o. male who presents for the following: Follow-up (Left cheek isk? Color change).  General skin examination Location:  Duration:  Quality: Enlargement of lesion right temple Associated Signs/Symptoms: Modifying Factors:  Severity:  Timing: Context: History of skin cancer Objective  Well appearing patient in no apparent distress; mood and affect are within normal limits. Objective  Head - Anterior (Face): No sign recurrence  Objective  Left Malar Cheek: 6 mm by chromic crust with focal erosion, favor I SK over pigmented Bowen's     Objective  Right Temple: History of enlargement of otherwise clinically irritated keratosis, 1.3 cm       Objective  Right Forehead: Hemorrhagic 6 mm crust  Objective  Head to Waist: Head to waist exam today no signs of atypical moles, or melanoma   All skin waist up examined.   Assessment & Plan    History of squamous cell carcinoma Head - Anterior (Face)  Check as needed change  Neoplasm of uncertain behavior of skin (3) Left Malar Cheek  Skin / nail biopsy Type of biopsy: tangential   Informed consent: discussed and consent obtained   Timeout: patient name, date of birth, surgical site, and procedure verified   Procedure prep:  Patient was prepped and draped in usual sterile fashion (Non sterile) Prep type:  Chlorhexidine Anesthesia: the lesion was anesthetized in a standard fashion   Anesthetic:  1% lidocaine w/ epinephrine 1-100,000 local infiltration Instrument used: flexible razor blade   Hemostasis achieved with: ferric subsulfate and electrodesiccation   Outcome: patient tolerated procedure well   Post-procedure details: sterile dressing applied and wound care instructions given   Dressing type: bandage and petrolatum    Specimen 1 - Surgical pathology Differential Diagnosis: R/O BCC vs SCC  Check Margins: No  Cautery   Right Temple  Skin /  nail biopsy Type of biopsy: tangential   Informed consent: discussed and consent obtained   Timeout: patient name, date of birth, surgical site, and procedure verified   Procedure prep:  Patient was prepped and draped in usual sterile fashion (Non sterile) Prep type:  Chlorhexidine Anesthesia: the lesion was anesthetized in a standard fashion   Anesthetic:  1% lidocaine w/ epinephrine 1-100,000 local infiltration Instrument used: flexible razor blade   Hemostasis achieved with: ferric subsulfate and electrodesiccation   Outcome: patient tolerated procedure well   Post-procedure details: sterile dressing applied and wound care instructions given   Dressing type: bandage and petrolatum    Specimen 2 - Surgical pathology Differential Diagnosis: R/O BCC vs SCC  Check Margins: No  Cautery  Right Forehead  Skin / nail biopsy Type of biopsy: tangential   Informed consent: discussed and consent obtained   Timeout: patient name, date of birth, surgical site, and procedure verified   Procedure prep:  Patient was prepped and draped in usual sterile fashion (Non sterile) Prep type:  Chlorhexidine Anesthesia: the lesion was anesthetized in a standard fashion   Anesthetic:  1% lidocaine w/ epinephrine 1-100,000 local infiltration Instrument used: flexible razor blade   Hemostasis achieved with: ferric subsulfate   Outcome: patient tolerated procedure well   Post-procedure details: sterile dressing applied and wound care instructions given   Dressing type: bandage and petrolatum    Specimen 3 - Surgical pathology Differential Diagnosis: R/O BCC vs SCC  Check Margins: No  Skin exam for malignant neoplasm Head to Waist  Annual skin examination, encouraged to self examine twice  annually again encouraged to quit smoking.     I, Nathan Monarch, MD, have reviewed all documentation for this visit.  The documentation on 04/25/20 for the exam, diagnosis, procedures, and orders are all  accurate and complete.

## 2020-04-26 ENCOUNTER — Other Ambulatory Visit (HOSPITAL_COMMUNITY): Payer: Medicare Other

## 2020-04-28 ENCOUNTER — Encounter (HOSPITAL_COMMUNITY): Payer: Self-pay

## 2020-04-28 ENCOUNTER — Ambulatory Visit (HOSPITAL_COMMUNITY)
Admission: RE | Admit: 2020-04-28 | Discharge: 2020-04-28 | Disposition: A | Discharge status: Home / Self Care | Payer: Medicare Other | Source: Ambulatory Visit | Attending: Orthopedic Surgery | Admitting: Orthopedic Surgery

## 2020-04-28 ENCOUNTER — Other Ambulatory Visit: Payer: Self-pay

## 2020-04-28 ENCOUNTER — Ambulatory Visit (HOSPITAL_COMMUNITY): Payer: Medicare Other

## 2020-04-28 DIAGNOSIS — M79651 Pain in right thigh: Secondary | ICD-10-CM | POA: Diagnosis not present

## 2020-04-28 DIAGNOSIS — I739 Peripheral vascular disease, unspecified: Secondary | ICD-10-CM | POA: Diagnosis not present

## 2020-04-28 DIAGNOSIS — Z96641 Presence of right artificial hip joint: Secondary | ICD-10-CM | POA: Diagnosis not present

## 2020-04-28 DIAGNOSIS — M898X5 Other specified disorders of bone, thigh: Secondary | ICD-10-CM | POA: Insufficient documentation

## 2020-04-28 DIAGNOSIS — Z471 Aftercare following joint replacement surgery: Secondary | ICD-10-CM | POA: Diagnosis not present

## 2020-05-01 ENCOUNTER — Other Ambulatory Visit: Payer: Medicare Other

## 2020-05-08 ENCOUNTER — Ambulatory Visit: Payer: Medicare Other | Admitting: Cardiology

## 2020-05-08 ENCOUNTER — Other Ambulatory Visit: Payer: Self-pay

## 2020-05-08 ENCOUNTER — Encounter: Payer: Self-pay | Admitting: Cardiology

## 2020-05-08 VITALS — BP 140/76 | HR 77 | Temp 97.8°F | Resp 16 | Ht 69.0 in | Wt 207.0 lb

## 2020-05-08 DIAGNOSIS — E7800 Pure hypercholesterolemia, unspecified: Secondary | ICD-10-CM | POA: Diagnosis not present

## 2020-05-08 DIAGNOSIS — Z720 Tobacco use: Secondary | ICD-10-CM

## 2020-05-08 DIAGNOSIS — I10 Essential (primary) hypertension: Secondary | ICD-10-CM

## 2020-05-08 DIAGNOSIS — I2510 Atherosclerotic heart disease of native coronary artery without angina pectoris: Secondary | ICD-10-CM | POA: Diagnosis not present

## 2020-05-08 DIAGNOSIS — R0989 Other specified symptoms and signs involving the circulatory and respiratory systems: Secondary | ICD-10-CM

## 2020-05-08 DIAGNOSIS — R9431 Abnormal electrocardiogram [ECG] [EKG]: Secondary | ICD-10-CM

## 2020-05-08 DIAGNOSIS — M1611 Unilateral primary osteoarthritis, right hip: Secondary | ICD-10-CM | POA: Diagnosis not present

## 2020-05-08 NOTE — Progress Notes (Signed)
Primary Physician/Referring:  Crist Infante, MD  Patient ID: Nathan Porter, male    DOB: September 15, 1955, 65 y.o.   MRN: [id number]  Chief Complaint  Patient presents with  . Coronary Artery Disease  . Hyperlipidemia  . Follow-up    6 month   HPI:    Nathan Porter  is a 64 y.o. Caucasian male with hypertension, hyperlipidemia, aortic and coronary atherosclerosis without angina noted on the CT scan for lung cancer screening in August 2020, tobacco use disorder presents here for 47-monthoffice visit for management of his risk factors.  Patient is still smoking cigarettes.  He underwent right hip replacement in May 2021 and has recuperated well.  He has not had any periprocedural cardiac complications. No specific complaints today and denies dyspnea except for cough no other symptoms.   Past Medical History:  Diagnosis Date  . Arthritis   . Cancer ([place]   skin  . COPD (chronic obstructive pulmonary disease) ([place]  . Hip discomfort   . History of kidney stones   . Hyperlipidemia   . Hypertension   . Squamous cell carcinoma of skin 12/01/2019   in situ-right triangular fossa   Past Surgical History:  Procedure Laterality Date  . CYSTOSCOPY    . HEMORRHOID BANDING  2019  . LITHOTRIPSY    . LYMPHADENECTOMY  03/2018  . TOTAL HIP ARTHROPLASTY Right 08/13/2019   Procedure: TOTAL HIP ARTHROPLASTY;  Surgeon: CEarlie Server MD;  Location: [facility];  Service: Orthopedics;  Laterality: Right;   Family History  Problem Relation Age of Onset  . Lung disease Mother        Tumor  . CAD Father        S/pCABG    Social History   Tobacco Use  . Smoking status: Current Every Day Smoker    Packs/day: 1.00    Years: 45.00    Pack years: 45.00  . Smokeless tobacco: Never Used  Substance Use Topics  . Alcohol use: Not Currently   Marital Status: Married  ROS  Review of Systems  Cardiovascular: Negative for chest pain, dyspnea on exertion and leg swelling.  Respiratory:  Positive for cough.   Gastrointestinal: Negative for melena.   Objective  Blood pressure 140/76, pulse 77, temperature 97.8 F (36.6 C), resp. rate 16, height '5\' 9"'  (1.753 m), weight 207 lb (93.9 kg), SpO2 96 %.  Vitals with BMI 05/08/2020 09/13/2019 08/14/2019  Height '5\' 9"'  '5\' 9"'  -  Weight 207 lbs 199 lbs -  BMI 338.75264.33-  Systolic [id number] Diastolic 76 75 79  Pulse 77 84 91     Physical Exam Cardiovascular:     Rate and Rhythm: Normal rate and regular rhythm.     Pulses: Intact distal pulses.     Heart sounds: Normal heart sounds. No murmur heard. No gallop.      Comments: No leg edema, no JVD. Pulmonary:     Effort: Pulmonary effort is normal.     Breath sounds: Normal breath sounds.  Abdominal:     General: Bowel sounds are normal.     Palpations: Abdomen is soft.    Laboratory examination:   Recent Labs    08/06/19 1539 08/14/19 0314  NA 139 131*  K 4.3 4.5  CL 107 100  CO2 26 22  GLUCOSE 120* 126*  BUN 27* 13  CREATININE 1.16 0.76  CALCIUM 8.8* 8.2*  GFRNONAA >60 >60  GFRAA >60 >60  CrCl cannot be calculated (Patient's most recent lab result is older than the maximum 21 days allowed.).  CMP Latest Ref Rng & Units 08/14/2019 08/06/2019  Glucose 70 - 99 mg/dL 126(H) 120(H)  BUN 8 - 23 mg/dL 13 27(H)  Creatinine 0.61 - 1.24 mg/dL 0.76 1.16  Sodium 135 - 145 mmol/L 131(L) 139  Potassium 3.5 - 5.1 mmol/L 4.5 4.3  Chloride 98 - 111 mmol/L 100 107  CO2 22 - 32 mmol/L 22 26  Calcium 8.9 - 10.3 mg/dL 8.2(L) 8.8(L)  Total Protein 6.5 - 8.1 g/dL - 6.6  Total Bilirubin 0.3 - 1.2 mg/dL - 0.4  Alkaline Phos 38 - 126 U/L - 75  AST 15 - 41 U/L - 16  ALT 0 - 44 U/L - 22   CBC Latest Ref Rng & Units 08/14/2019 08/06/2019  WBC 4.0 - 10.5 K/uL 15.2(H) 8.9  Hemoglobin 13.0 - 17.0 g/dL 13.0 14.5  Hematocrit 39.0 - 52.0 % 39.0 44.0  Platelets 150 - 400 K/uL 285 287   External labs:   Cholesterol, total 123.000 m 11/30/2019 HDL 39.000 mg 11/30/2019 LDL 70.000 mg  11/30/2019 Triglycerides 69.000 mg 11/30/2019   Hemoglobin 13.000 g/d 08/14/2019  Creatinine, Serum 0.760 mg/ 08/14/2019 Potassium 4.700 mEq 11/30/2019 Magnesium N/D ALT (SGPT) 17.000 IU/ 11/30/2019  TSH 1.120 11/30/2019  Cholesterol, total 129.000 m 04/12/2019 Triglycerides 62.000 04/12/2019 HDL 40 MG/DL 04/12/2019 LDL 77.000 mg 04/12/2019  Hemoglobin 13.000 08/14/2019 Creatinine, Serum 0.760 08/14/2019 Potassium 4.500 08/14/2019  10/01/2018: Glucose 93. BUN/Cr 19/1.0. eGFR 75. Na/K 141/4.8 H/H 16/49. MCV 92. Plateelts 274. Chol 190, TG 99, HDL 39, LDL 131. LDL-P 1348 nmol/L (<1000) HDL-P 26.6 nmol/L (>30.5)  Medications and allergies  No Known Allergies   Current Outpatient Medications  Medication Instructions  . amLODipine (NORVASC) 5 MG tablet TAKE 1 TABLET BY MOUTH EVERY DAY  . HYDROcodone-acetaminophen (NORCO/VICODIN) 5-325 MG tablet Take 1 tab po q4-6hrs prn pain, may need 1-2 first couple weeks on occasion, max 8 tabs per day.  . polycarbophil (FIBERCON) 1,250 mg, Oral, Daily  . promethazine (PHENERGAN) 25 mg, Oral, 2 times daily PRN  . rosuvastatin (CRESTOR) 20 mg, Oral, Daily at bedtime  . tamsulosin (FLOMAX) 0.4 MG CAPS capsule 2 capsules, Oral, Daily at bedtime  . traMADol (ULTRAM) 50 mg, Oral, 2 times daily PRN  . triamcinolone (NASACORT) 55 MCG/ACT AERO nasal inhaler 2 sprays, Nasal, Daily    Radiology:   CT chest 11/10/2018: 1. Lung-RADS 2S, benign appearance or behavior. Continue annual screening with low-dose chest CT without contrast in 12 months. 2. The "S" modifier above refers to potentially clinically significant non lung cancer related findings. Specifically, there is aortic atherosclerosis, in addition to left main and 3 vessel coronary artery disease. Please note that although the presence of coronary artery calcium documents the presence of coronary artery disease, the severity of this disease and any potential stenosis cannot be assessed on this  non-gated CT examination. Assessment for potential risk factor modification, dietary therapy or pharmacologic therapy may be warranted, if clinically indicated. 3. Mild diffuse bronchial wall thickening with mild to moderate centrilobular and mild paraseptal emphysema; imaging findings suggestive of underlying COPD.  Aortic Atherosclerosis (ICD10-I70.0) and Emphysema (ICD10-J43.9).  Cardiac Studies:   Abdominal Aortic Duplex  01/08/2019: No AAA observed.  The maximum aorta (sac) diameter is 2.24 cm (mid). Diffuse plaque observed in the mid aorta.  Mild increase in left internal iliac artery velocity and suggests <50% stenosis, with normal triphasic wave pattern.   EKG  EKG 05/08/2020: Normal sinus rhythm at rate of 71 bpm, normal axis, nonspecific T abnormality.  No significant change from EKG 09/13/2019   Assessment     ICD-10-CM   1. Coronary artery calcification seen on CAT scan  I25.10 EKG 12-Lead    PCV CARDIAC STRESS TEST    PCV ECHOCARDIOGRAM COMPLETE  2. Abnormal lung sounds  R09.89 Ambulatory referral to Pulmonology    PCV ECHOCARDIOGRAM COMPLETE  3. Essential hypertension  I10 PCV ECHOCARDIOGRAM COMPLETE  4. Hypercholesteremia  E78.00   5. Nonspecific abnormal electrocardiogram (ECG) (EKG)  R94.31 PCV CARDIAC STRESS TEST  6. Tobacco abuse  Z72.0    No orders of the defined types were placed in this encounter.   Medications Discontinued During This Encounter  Medication Reason  . aspirin EC 81 MG tablet Error  . buPROPion (WELLBUTRIN XL) 150 MG 24 hr tablet Error  . meloxicam (MOBIC) 15 MG tablet Error   Orders Placed This Encounter  Procedures  . Ambulatory referral to Pulmonology    Referral Priority:   Routine    Referral Type:   Consultation    Referral Reason:   Specialty Services Required    Referred to Provider:   Collene Gobble, MD    Requested Specialty:   Pulmonary Disease    Number of Visits Requested:   1  . PCV CARDIAC STRESS TEST     Standing Status:   Future    Standing Expiration Date:   07/06/2020  . EKG 12-Lead  . PCV ECHOCARDIOGRAM COMPLETE    Standing Status:   Future    Standing Expiration Date:   05/08/2021    Recommendations:   Nathan Porter  is a  65 y.o. Caucasian male with hypertension, hyperlipidemia, aortic and coronary atherosclerosis without angina noted on the CT scan for lung cancer screening in August 2020, tobacco use disorder presents here for 96-monthoffice visit for management of his risk factors.  He continues to remain asymptomatic and denies any dyspnea or chest pain although he does have cough.  On physical exam he has fine diffuse crackles throughout bilateral lungs, I am concerned about pulmonary fibrosis.  He had seen Dr. RBaltazar Apoin the past, will refer him back.  Although he denies dyspnea or chest pain, in view of severe chronic calcification especially noted in the left main and nonspecific EKG abnormality, I recommend a routine treadmill stress test and also an echocardiogram to evaluate for any RV strain and pulmonary hypertension as well.  Extensive discussion regarding smoking cessation was held.  Offered him medications.  I reviewed his external labs, LDL is now at goal.  He is tolerating all his medications well.  Blood pressure slightly elevated today but in general well controlled hence I did not make any changes to his medication.  I would like to see him back in 6 months instead of on annual basis.  He will contact me if he has any new symptoms of dyspnea or chest pain or palpitations.   JAdrian Prows MD, FDecatur Morgan West2/21/2022, 9:04 AM Office: [phone]

## 2020-05-15 ENCOUNTER — Ambulatory Visit: Payer: Medicare Other

## 2020-05-15 ENCOUNTER — Other Ambulatory Visit: Payer: Self-pay

## 2020-05-15 DIAGNOSIS — R9431 Abnormal electrocardiogram [ECG] [EKG]: Secondary | ICD-10-CM | POA: Diagnosis not present

## 2020-05-15 DIAGNOSIS — I2510 Atherosclerotic heart disease of native coronary artery without angina pectoris: Secondary | ICD-10-CM | POA: Diagnosis not present

## 2020-05-23 ENCOUNTER — Other Ambulatory Visit: Payer: Self-pay

## 2020-05-23 ENCOUNTER — Ambulatory Visit: Payer: Medicare Other

## 2020-05-23 DIAGNOSIS — I2510 Atherosclerotic heart disease of native coronary artery without angina pectoris: Secondary | ICD-10-CM | POA: Diagnosis not present

## 2020-05-23 DIAGNOSIS — R0989 Other specified symptoms and signs involving the circulatory and respiratory systems: Secondary | ICD-10-CM | POA: Diagnosis not present

## 2020-05-23 DIAGNOSIS — I10 Essential (primary) hypertension: Secondary | ICD-10-CM

## 2020-07-10 DIAGNOSIS — M79642 Pain in left hand: Secondary | ICD-10-CM | POA: Diagnosis not present

## 2020-07-10 DIAGNOSIS — M150 Primary generalized (osteo)arthritis: Secondary | ICD-10-CM | POA: Diagnosis not present

## 2020-07-10 DIAGNOSIS — R252 Cramp and spasm: Secondary | ICD-10-CM | POA: Diagnosis not present

## 2020-07-10 DIAGNOSIS — M0579 Rheumatoid arthritis with rheumatoid factor of multiple sites without organ or systems involvement: Secondary | ICD-10-CM | POA: Diagnosis not present

## 2020-07-10 DIAGNOSIS — M79641 Pain in right hand: Secondary | ICD-10-CM | POA: Diagnosis not present

## 2020-08-28 DIAGNOSIS — M79642 Pain in left hand: Secondary | ICD-10-CM | POA: Diagnosis not present

## 2020-08-28 DIAGNOSIS — M0579 Rheumatoid arthritis with rheumatoid factor of multiple sites without organ or systems involvement: Secondary | ICD-10-CM | POA: Diagnosis not present

## 2020-08-28 DIAGNOSIS — M150 Primary generalized (osteo)arthritis: Secondary | ICD-10-CM | POA: Diagnosis not present

## 2020-08-28 DIAGNOSIS — M79641 Pain in right hand: Secondary | ICD-10-CM | POA: Diagnosis not present

## 2020-09-13 ENCOUNTER — Other Ambulatory Visit: Payer: Self-pay

## 2020-09-13 ENCOUNTER — Ambulatory Visit: Payer: Medicare Other | Admitting: Emergency Medicine

## 2020-09-13 ENCOUNTER — Encounter: Payer: Self-pay | Admitting: Emergency Medicine

## 2020-09-13 DIAGNOSIS — M069 Rheumatoid arthritis, unspecified: Secondary | ICD-10-CM | POA: Insufficient documentation

## 2020-09-13 DIAGNOSIS — Z720 Tobacco use: Secondary | ICD-10-CM | POA: Diagnosis not present

## 2020-09-13 DIAGNOSIS — M05741 Rheumatoid arthritis with rheumatoid factor of right hand without organ or systems involvement: Secondary | ICD-10-CM

## 2020-09-13 DIAGNOSIS — J449 Chronic obstructive pulmonary disease, unspecified: Secondary | ICD-10-CM | POA: Diagnosis not present

## 2020-09-13 DIAGNOSIS — M05742 Rheumatoid arthritis with rheumatoid factor of left hand without organ or systems involvement: Secondary | ICD-10-CM

## 2020-09-13 MED ORDER — ALBUTEROL SULFATE HFA 108 (90 BASE) MCG/ACT IN AERS: 2.0000 | INHALATION_SPRAY | Freq: Four times a day (QID) | RESPIRATORY_TRACT | 6 refills | Status: AC | PRN

## 2020-09-13 NOTE — Addendum Note (Signed)
Addended by: Gavin Potters R on: 09/13/2020 05:12 PM   Modules accepted: Orders

## 2020-09-13 NOTE — Progress Notes (Signed)
Subjective:    Patient ID: Nathan Porter, male    DOB: [birth year], 65 y.o.   MRN: [id number]  HPI 65 year old smoker (45 pack years, smokes 1 pack daily), with hypertension, hyperlipidemia, CAD based on coronary calcification on LDCT performed for lung cancer screening 10/2018.  That was a RADS 2 study, reassuring.  Referred today for pulmonary evaluation and restratification in preparation for right total hip replacement by Dr. French Ana.   He was told that he had COPD about 5 months ago, based on emphysematous changes on LDCT 10/2018. 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 Activity limited some by his R hip pain, some deconditioning. He has a cough, bothers him at night, does not wake him. Has heard wheeze before, usually when he needs to cough. He is able walk indefinitely without dyspnea, some SOB stairs. He can play golf but was SOB with the hills. On no BD. He has tried chantix, ineffective. Now on wellbutrin and has cut down to 15 cig daily.    ROV 09/13/20 --follow-up visit for 65 year old smoker (46 pack years) with hypertension, hyperlipidemia, CAD.  Follows up today for his history of tobacco, probable gold B COPD.  He has not had pulmonary function testing.  He was dx with RA about 4 months ago, just started on MTX beginning of the year. Since last time he had his right hip replacement. Still smoking about 1 pk a day.  He has had some spells of prolonged coughing, especially at night, but not happening now. Has some occasional cough during the day, not really productive. Not really limited by breathing. No wheeze.   Low-dose CT chest 12/21/2019 reviewed by me showed moderate centrilobular emphysema and some calcified and noncalcified pulmonary nodules that are unchanged, RADS 2 study.   Review of Systems  Constitutional:  Negative for activity change, appetite change, chills, diaphoresis, fatigue, fever and unexpected weight change.  HENT:  Negative for  congestion, dental problem, nosebleeds, postnasal drip, rhinorrhea, sinus pressure, sneezing, trouble swallowing and voice change.   Eyes:  Negative for itching and visual disturbance.  Respiratory:  Positive for cough and shortness of breath. Negative for choking, chest tightness, wheezing and stridor.   Cardiovascular:  Negative for chest pain, palpitations and leg swelling.  Gastrointestinal:  Negative for abdominal pain.  Musculoskeletal:  Positive for gait problem and joint swelling. Negative for myalgias.  Skin:  Negative for rash.  Neurological:  Negative for syncope, light-headedness and headaches.  Psychiatric/Behavioral:  Negative for sleep disturbance.       Objective:   Physical Exam  Vitals:   09/13/20 1639  BP: 108/68  Pulse: 72  Temp: 98.4 F (36.9 C)  TempSrc: Temporal  SpO2: 96%  Weight: 192 lb (87.1 kg)  Height: 5\' 8"  (1.727 m)   Gen: Pleasant, overwt man, in no distress,  normal affect  ENT: No lesions,  mouth clear,  oropharynx clear, no postnasal drip  Neck: No JVD, no stridor  Lungs: No use of accessory muscles, no crackles or wheezing on normal respiration, he does wheeze on forced expiration  Cardiovascular: RRR, heart sounds normal, no murmur or gallops, no peripheral edema  Musculoskeletal: No deformities, no cyanosis or clubbing  Neuro: alert, awake, non focal  Skin: Warm, no lesions or rash     Assessment & Plan:  Tobacco abuse Discussed the importance of cessation with him today.  First will need to work on cutting down.  1 barrier is that  his wife smokes also.  He has used Chantix and Wellbutrin in the past.  We may be able to take advantage of nicotine patches in order to just cut down.   Lung cancer screening CT in October  COPD (chronic obstructive pulmonary disease) (HCC) Gold B disease.  We will perform pulmonary function testing at some point in the future.  No clear indication for scheduled bronchodilators at this time.  He does  need an albuterol to have available to use if needed.  Rheumatoid arthritis ([place]) Will need to follow interval chest imaging since he has new diagnosis of RA, is on methotrexate.  Should be able to take care of this with his annual lung cancer screening CT scans.  Baltazar Apo, MD, PhD 09/13/2020, 5:08 PM [facility] [phone] or if no answer [phone]

## 2020-09-13 NOTE — Assessment & Plan Note (Addendum)
Discussed the importance of cessation with him today.  First will need to work on cutting down.  1 barrier is that his wife smokes also.  He has used Chantix and Wellbutrin in the past.  We may be able to take advantage of nicotine patches in order to just cut down.   Lung cancer screening CT in October

## 2020-09-13 NOTE — Patient Instructions (Addendum)
Please work on decreasing your smoking. We will give you a prescription for albuterol.  You can use 2 puffs up to every 4 hours if you were to need it for shortness of breath, chest tightness, wheezing. We will hold off on starting a scheduled inhaler medication for now. We can talk about the timing of pulmonary function testing at your next office visit We will repeat your lung cancer screening CT scan of the chest in October 2022. Follow Dr. Lamonte Sakai in October after your CT so that we can review the results together.

## 2020-09-13 NOTE — Assessment & Plan Note (Signed)
Gold B disease.  We will perform pulmonary function testing at some point in the future.  No clear indication for scheduled bronchodilators at this time.  He does need an albuterol to have available to use if needed.

## 2020-09-13 NOTE — Assessment & Plan Note (Signed)
Will need to follow interval chest imaging since he has new diagnosis of RA, is on methotrexate.  Should be able to take care of this with his annual lung cancer screening CT scans.

## 2020-10-13 ENCOUNTER — Other Ambulatory Visit: Payer: Self-pay | Admitting: Cardiology

## 2020-10-13 DIAGNOSIS — I10 Essential (primary) hypertension: Secondary | ICD-10-CM

## 2020-10-17 ENCOUNTER — Encounter: Payer: Self-pay | Admitting: Dermatology

## 2020-10-17 NOTE — Progress Notes (Signed)
   Follow-Up Visit   Subjective  Nathan Porter is a 66 y.o. male who presents for the following: Procedure (here for treatment-right triangular fossa- scc x 1) and Eczema.  CIS right ear plus new growth left cheek plus check some other spots Location:  Duration:  Quality:  Associated Signs/Symptoms: Modifying Factors:  Severity:  Timing: Context:   Objective  Well appearing patient in no apparent distress; mood and affect are within normal limits. Right Triangular Fossa Biopsy site identified by nurse and me.  Left Forehead, Left Malar Cheek Tan textured flattopped 5 mm papules  Left Malar Cheek Verrucous 4 mm hornlike crust       A focused examination was performed including head, neck, arms. Relevant physical exam findings are noted in the Assessment and Plan.   Assessment & Plan    Squamous cell carcinoma in situ Right Triangular Fossa  Destruction of lesion Complexity: simple   Destruction method: electrodesiccation and curettage   Informed consent: discussed and consent obtained   Timeout:  patient name, date of birth, surgical site, and procedure verified Anesthesia: the lesion was anesthetized in a standard fashion   Anesthetic:  1% lidocaine w/ epinephrine 1-100,000 local infiltration Curettage performed in three different directions: Yes     Electrodesiccation performed over the curetted area: No   Curettage cycles:  3 Lesion length (cm):  1.1 Lesion width (cm):  1.1 Margin per side (cm):  0 Final wound size (cm):  1.1 Hemostasis achieved with:  ferric subsulfate Outcome: patient tolerated procedure well with no complications   Post-procedure details: sterile dressing applied and wound care instructions given   Dressing type: bandage   Additional details:  Wound innoculated with 5 fluorouracil solution.  Debridement  done.  Follow up in 3 months.  Seborrheic keratosis (2) Left Malar Cheek; Left Forehead  Benign no treatment needed if  stable  Neoplasm of uncertain behavior of skin Left Malar Cheek  Skin / nail biopsy Type of biopsy: tangential   Informed consent: discussed and consent obtained   Timeout: patient name, date of birth, surgical site, and procedure verified   Procedure prep:  Patient was prepped and draped in usual sterile fashion (Non sterile) Prep type:  Chlorhexidine Anesthesia: the lesion was anesthetized in a standard fashion   Anesthetic:  1% lidocaine w/ epinephrine 1-100,000 local infiltration Instrument used: flexible razor blade   Hemostasis achieved with: aluminum chloride   Outcome: patient tolerated procedure well   Post-procedure details: wound care instructions given   Additional details:  Curet after biopsy   Specimen 1 - Surgical pathology Differential Diagnosis: R/O wart Check Margins: No Curet after treatment      I, [name], MD, have reviewed all documentation for this visit.  The documentation on 10/17/20 for the exam, diagnosis, procedures, and orders are all accurate and complete.

## 2020-11-06 ENCOUNTER — Ambulatory Visit: Payer: Medicare Other | Admitting: Cardiology

## 2020-11-07 ENCOUNTER — Encounter: Payer: Self-pay | Admitting: Cardiology

## 2020-11-07 ENCOUNTER — Ambulatory Visit: Payer: Medicare Other | Admitting: Cardiology

## 2020-11-07 ENCOUNTER — Other Ambulatory Visit: Payer: Self-pay

## 2020-11-07 VITALS — BP 118/71 | HR 69 | Temp 98.2°F | Resp 16 | Ht 68.0 in | Wt 186.0 lb

## 2020-11-07 DIAGNOSIS — I2510 Atherosclerotic heart disease of native coronary artery without angina pectoris: Secondary | ICD-10-CM

## 2020-11-07 DIAGNOSIS — F17210 Nicotine dependence, cigarettes, uncomplicated: Secondary | ICD-10-CM

## 2020-11-07 DIAGNOSIS — E7800 Pure hypercholesterolemia, unspecified: Secondary | ICD-10-CM | POA: Diagnosis not present

## 2020-11-07 DIAGNOSIS — I10 Essential (primary) hypertension: Secondary | ICD-10-CM | POA: Diagnosis not present

## 2020-11-07 NOTE — Progress Notes (Signed)
Primary Physician/Referring:  Crist Infante, MD  Patient ID: Nathan Porter, male    DOB: January 30, 1956, 65 y.o.   MRN: [id number]  Chief Complaint  Patient presents with   Follow-up   CORONARY ATHEROSCLEROSIS   Hyperlipidemia   HPI:    Nathan Porter  is a 65 y.o. Caucasian male with hypertension, hyperlipidemia, aortic and coronary atherosclerosis without angina noted on the CT scan for lung cancer screening in August 2020, tobacco use disorder presents here for 51-monthoffice visit for management of his risk factors.  Patient is still smoking cigarettes.  No specific complaints today and denies dyspnea except for cough no other symptoms.   Past Medical History:  Diagnosis Date   Arthritis    Cancer ([facility] 2019   skin   COPD (chronic obstructive pulmonary disease) ([facility] 2020   Hip discomfort    History of kidney stones    Hyperlipidemia    Hypertension    Squamous cell carcinoma of skin 12/01/2019   in situ-right triangular fossa   Past Surgical History:  Procedure Laterality Date   CYSTOSCOPY     HEMORRHOID BANDING  2019   LITHOTRIPSY     LYMPHADENECTOMY  03/2018   TOTAL HIP ARTHROPLASTY Right 08/13/2019   Procedure: TOTAL HIP ARTHROPLASTY;  Surgeon: CEarlie Server MD;  Location: [facility];  Service: Orthopedics;  Laterality: Right;   Family History  Problem Relation Age of Onset   Lung disease Mother        Tumor   CAD Father        S/pCABG    Social History   Tobacco Use   Smoking status: Every Day    Packs/day: 1.00    Years: 45.00    Pack years: 45.00    Types: Cigarettes   Smokeless tobacco: Never   Tobacco comments:    Pt smokes 1  pack  of cigarettes a day 09/13/20 [facility]   Substance Use Topics   Alcohol use: Not Currently   Marital Status: Married  ROS  Review of Systems  Cardiovascular:  Negative for chest pain, dyspnea on exertion and leg swelling.  Respiratory:  Positive for cough.   Gastrointestinal:  Negative for melena.  Objective  Blood  pressure 118/71, pulse 69, temperature 98.2 F (36.8 C), temperature source Temporal, resp. rate 16, height _0  (1.727 m), weight 186 lb (84.4 kg), SpO2 96 %.  Vitals with BMI 11/07/2020 09/13/2020 05/08/2020  Height _1  _2  _3   Weight 186 lbs 192 lbs 207 lbs  BMI 28.29 [phone] Systolic [id number] Diastolic 71 68 76  Pulse 69 72 77     Physical Exam Neck:     Vascular: No carotid bruit or JVD.  Cardiovascular:     Rate and Rhythm: Normal rate and regular rhythm.     Pulses: Normal pulses and intact distal pulses.     Heart sounds: Normal heart sounds. No murmur heard.   No gallop.  Pulmonary:     Effort: Pulmonary effort is normal.     Breath sounds: Normal air entry. Examination of the right-lower field reveals rales. Rales present.  Abdominal:     General: Bowel sounds are normal.     Palpations: Abdomen is soft.  Musculoskeletal:        General: No swelling.   Laboratory examination:   No results for input(s): NA, K, CL, CO2, GLUCOSE, BUN, CREATININE, CALCIUM, GFRNONAA, GFRAA in the last 8760 hours.  CrCl cannot  be calculated (Patient's most recent lab result is older than the maximum 21 days allowed.).  CMP Latest Ref Rng & Units 08/14/2019 08/06/2019  Glucose 70 - 99 mg/dL 126(H) 120(H)  BUN 8 - 23 mg/dL 13 27(H)  Creatinine 0.61 - 1.24 mg/dL 0.76 1.16  Sodium 135 - 145 mmol/L 131(L) 139  Potassium 3.5 - 5.1 mmol/L 4.5 4.3  Chloride 98 - 111 mmol/L 100 107  CO2 22 - 32 mmol/L 22 26  Calcium 8.9 - 10.3 mg/dL 8.2(L) 8.8(L)  Total Protein 6.5 - 8.1 g/dL - 6.6  Total Bilirubin 0.3 - 1.2 mg/dL - 0.4  Alkaline Phos 38 - 126 U/L - 75  AST 15 - 41 U/L - 16  ALT 0 - 44 U/L - 22   CBC Latest Ref Rng & Units 08/14/2019 08/06/2019  WBC 4.0 - 10.5 K/uL 15.2(H) 8.9  Hemoglobin 13.0 - 17.0 g/dL 13.0 14.5  Hematocrit 39.0 - 52.0 % 39.0 44.0  Platelets 150 - 400 K/uL 285 287   External labs:   Cholesterol, total 123.000 m 11/30/2019 HDL 39.000 mg 11/30/2019 LDL  70.000 mg 11/30/2019 Triglycerides 69.000 mg 11/30/2019   Hemoglobin 13.000 g/d 08/14/2019  Creatinine, Serum 0.760 mg/ 08/14/2019 Potassium 4.700 mEq 11/30/2019 Magnesium N/D ALT (SGPT) 17.000 IU/ 11/30/2019  TSH 1.120 11/30/2019  Cholesterol, total 129.000 m 04/12/2019 Triglycerides 62.000 04/12/2019 HDL 40 MG/DL 04/12/2019 LDL 77.000 mg 04/12/2019  Hemoglobin 13.000 08/14/2019 Creatinine, Serum 0.760 08/14/2019 Potassium 4.500 08/14/2019  10/01/2018: Glucose 93. BUN/Cr 19/1.0. eGFR 75. Na/K 141/4.8 H/H 16/49. MCV 92. Plateelts 274. Chol 190, TG 99, HDL 39, LDL 131. LDL-P 1348 nmol/L (<1000) HDL-P 26.6 nmol/L (>30.5)  Allergies  No Known Allergies   Medications Prior to Visit:   Outpatient Medications Prior to Visit  Medication Sig Dispense Refill   albuterol (VENTOLIN HFA) 108 (90 Base) MCG/ACT inhaler Inhale 2 puffs into the lungs every 6 (six) hours as needed for wheezing or shortness of breath. 8 g 6   amLODipine (NORVASC) 5 MG tablet TAKE 1 TABLET BY MOUTH EVERY DAY 90 tablet 3   folic acid (FOLVITE) 1 MG tablet Take 1 mg by mouth daily.     HYDROcodone-acetaminophen (NORCO/VICODIN) 5-325 MG tablet Take 1 tab po q4-6hrs prn pain, may need 1-2 first couple weeks on occasion, max 8 tabs per day. 40 tablet 0   methotrexate 2.5 MG tablet Take 15 mg by mouth once a week.     polycarbophil (FIBERCON) 625 MG tablet Take 1,250 mg by mouth daily.     promethazine (PHENERGAN) 25 MG tablet Take 25 mg by mouth 2 (two) times daily as needed (nausea associated with kidney stones.).      rosuvastatin (CRESTOR) 20 MG tablet Take 20 mg by mouth at bedtime.     tamsulosin (FLOMAX) 0.4 MG CAPS capsule Take 2 capsules by mouth at bedtime.     traMADol (ULTRAM) 50 MG tablet Take 50 mg by mouth 2 (two) times daily as needed (pain associated with kidney stones.).      triamcinolone (NASACORT) 55 MCG/ACT AERO nasal inhaler Place 2 sprays into the nose daily.     No facility-administered medications  prior to visit.   Final Medications at End of Visit    Current Meds  Medication Sig   albuterol (VENTOLIN HFA) 108 (90 Base) MCG/ACT inhaler Inhale 2 puffs into the lungs every 6 (six) hours as needed for wheezing or shortness of breath.   amLODipine (NORVASC) 5 MG tablet TAKE 1 TABLET BY  MOUTH EVERY DAY   folic acid (FOLVITE) 1 MG tablet Take 1 mg by mouth daily.   HYDROcodone-acetaminophen (NORCO/VICODIN) 5-325 MG tablet Take 1 tab po q4-6hrs prn pain, may need 1-2 first couple weeks on occasion, max 8 tabs per day.   methotrexate 2.5 MG tablet Take 15 mg by mouth once a week.   polycarbophil (FIBERCON) 625 MG tablet Take 1,250 mg by mouth daily.   promethazine (PHENERGAN) 25 MG tablet Take 25 mg by mouth 2 (two) times daily as needed (nausea associated with kidney stones.).    rosuvastatin (CRESTOR) 20 MG tablet Take 20 mg by mouth at bedtime.   tamsulosin (FLOMAX) 0.4 MG CAPS capsule Take 2 capsules by mouth at bedtime.   traMADol (ULTRAM) 50 MG tablet Take 50 mg by mouth 2 (two) times daily as needed (pain associated with kidney stones.).    triamcinolone (NASACORT) 55 MCG/ACT AERO nasal inhaler Place 2 sprays into the nose daily.   Radiology:   CT chest 11/10/2018: 1. Lung-RADS 2S, benign appearance or behavior. Continue annual screening with low-dose chest CT without contrast in 12 months. 2. The "S" modifier above refers to potentially clinically significant non lung cancer related findings. Specifically, there is aortic atherosclerosis, in addition to left main and 3 vessel coronary artery disease. Please note that although the presence of coronary artery calcium documents the presence of coronary artery disease, the severity of this disease and any potential stenosis cannot be assessed on this non-gated CT examination. Assessment for potential risk factor modification, dietary therapy or pharmacologic therapy may be warranted, if clinically indicated. 3. Mild diffuse  bronchial wall thickening with mild to moderate centrilobular and mild paraseptal emphysema; imaging findings suggestive of underlying COPD.   Aortic Atherosclerosis (ICD10-I70.0) and Emphysema (ICD10-J43.9).  Cardiac Studies:   Abdominal Aortic Duplex  01/08/2019: No AAA observed.  The maximum aorta (sac) diameter is 2.24 cm (mid). Diffuse plaque observed in the mid aorta.  Mild increase in left internal iliac artery velocity and suggests <50% stenosis, with normal triphasic wave pattern.   PCV CARDIAC STRESS TEST 05/15/2020 Exercise treadmill stress test performed using Bruce protocol.  Patient reached 7.2 METS, and 93% of age predicted maximum heart rate.  Exercise capacity was low normal.  No chest pain reported.  Normal heart rate and hemodynamic response. Stress EKG revealed no ischemic changes. Low risk study.  PCV ECHOCARDIOGRAM COMPLETE 05/23/2020 Left ventricle cavity is normal in size and wall thickness. Normal global wall motion. Normal LV systolic function with EF 67%. Normal diastolic filling pattern. No significant valvular abnormality. normal right atrial pressure.   EKG   EKG 11/07/2020: Normal sinus rhythm at rate of 67 bpm, left atrial enlargement, leftward axis.  Incomplete right bundle branch block.  No evidence of ischemia.  No significant change from 05/08/2020.  Assessment     ICD-10-CM   1. Essential hypertension  I10     2. Hypercholesteremia  E78.00     3. Coronary artery calcification seen on CAT scan  I25.10 EKG 12-Lead    4. Nicotine dependence, cigarettes, uncomplicated  V49.449      No orders of the defined types were placed in this encounter.   There are no discontinued medications.  Orders Placed This Encounter  Procedures   EKG 12-Lead    Recommendations:   Nathan Porter  is a  65 y.o. Caucasian male with hypertension, hyperlipidemia, aortic and coronary atherosclerosis without angina noted on the CT scan for lung cancer screening in  August 2020, tobacco use disorder presents here for 98-monthoffice visit for management of his risk factors.  He continues to remain asymptomatic and denies any dyspnea or chest pain although he does have chronic cough.  Advised him to try over-the-counter cough suppressants and also to try PPI.  Smoking cessation discussed.  I reviewed his external labs, LDL is now at goal.  He is tolerating all his medications well.  Blood pressure is also well controlled, he has also lost close to 22 pounds in weight with diet and exercise.  Congratulated him.  No changes in the medications were done today.  I will see him back in a year and if he remains stable on a as needed basis.     JAdrian Prows MD, [facility]8/23/2022, 5:25 PM Office: [phone]

## 2020-11-30 DIAGNOSIS — M0579 Rheumatoid arthritis with rheumatoid factor of multiple sites without organ or systems involvement: Secondary | ICD-10-CM | POA: Diagnosis not present

## 2020-11-30 DIAGNOSIS — M150 Primary generalized (osteo)arthritis: Secondary | ICD-10-CM | POA: Diagnosis not present

## 2020-11-30 DIAGNOSIS — M25552 Pain in left hip: Secondary | ICD-10-CM | POA: Diagnosis not present

## 2020-11-30 DIAGNOSIS — Z6827 Body mass index (BMI) 27.0-27.9, adult: Secondary | ICD-10-CM | POA: Diagnosis not present

## 2021-01-05 DIAGNOSIS — I2510 Atherosclerotic heart disease of native coronary artery without angina pectoris: Secondary | ICD-10-CM | POA: Diagnosis not present

## 2021-01-05 DIAGNOSIS — Z79899 Other long term (current) drug therapy: Secondary | ICD-10-CM | POA: Diagnosis not present

## 2021-01-05 DIAGNOSIS — Z125 Encounter for screening for malignant neoplasm of prostate: Secondary | ICD-10-CM | POA: Diagnosis not present

## 2021-01-12 DIAGNOSIS — I2510 Atherosclerotic heart disease of native coronary artery without angina pectoris: Secondary | ICD-10-CM | POA: Diagnosis not present

## 2021-01-12 DIAGNOSIS — J449 Chronic obstructive pulmonary disease, unspecified: Secondary | ICD-10-CM | POA: Diagnosis not present

## 2021-01-12 DIAGNOSIS — R809 Proteinuria, unspecified: Secondary | ICD-10-CM | POA: Diagnosis not present

## 2021-01-12 DIAGNOSIS — Z0000 Encounter for general adult medical examination without abnormal findings: Secondary | ICD-10-CM | POA: Diagnosis not present

## 2021-01-12 DIAGNOSIS — R82998 Other abnormal findings in urine: Secondary | ICD-10-CM | POA: Diagnosis not present

## 2021-01-12 DIAGNOSIS — Z1212 Encounter for screening for malignant neoplasm of rectum: Secondary | ICD-10-CM | POA: Diagnosis not present

## 2021-01-12 DIAGNOSIS — F17200 Nicotine dependence, unspecified, uncomplicated: Secondary | ICD-10-CM | POA: Diagnosis not present

## 2021-01-15 ENCOUNTER — Other Ambulatory Visit: Payer: Self-pay | Admitting: Internal Medicine

## 2021-01-15 DIAGNOSIS — F17200 Nicotine dependence, unspecified, uncomplicated: Secondary | ICD-10-CM

## 2021-01-22 DIAGNOSIS — J449 Chronic obstructive pulmonary disease, unspecified: Secondary | ICD-10-CM | POA: Diagnosis not present

## 2021-01-22 DIAGNOSIS — R300 Dysuria: Secondary | ICD-10-CM | POA: Diagnosis not present

## 2021-01-25 ENCOUNTER — Ambulatory Visit: Payer: Medicare Other | Admitting: Dermatology

## 2021-01-25 ENCOUNTER — Encounter: Payer: Self-pay | Admitting: Dermatology

## 2021-01-25 ENCOUNTER — Other Ambulatory Visit: Payer: Self-pay

## 2021-01-25 DIAGNOSIS — B079 Viral wart, unspecified: Secondary | ICD-10-CM

## 2021-01-25 DIAGNOSIS — Z1283 Encounter for screening for malignant neoplasm of skin: Secondary | ICD-10-CM | POA: Diagnosis not present

## 2021-01-25 DIAGNOSIS — L820 Inflamed seborrheic keratosis: Secondary | ICD-10-CM | POA: Diagnosis not present

## 2021-01-25 DIAGNOSIS — D0421 Carcinoma in situ of skin of right ear and external auricular canal: Secondary | ICD-10-CM | POA: Diagnosis not present

## 2021-01-25 DIAGNOSIS — D485 Neoplasm of uncertain behavior of skin: Secondary | ICD-10-CM

## 2021-01-25 DIAGNOSIS — L570 Actinic keratosis: Secondary | ICD-10-CM

## 2021-01-25 DIAGNOSIS — L821 Other seborrheic keratosis: Secondary | ICD-10-CM

## 2021-01-25 NOTE — Patient Instructions (Signed)

## 2021-01-30 ENCOUNTER — Telehealth: Payer: Self-pay | Admitting: *Deleted

## 2021-01-30 NOTE — Telephone Encounter (Signed)
Pathology to patient-surgery appointment scheduled.  

## 2021-01-30 NOTE — Telephone Encounter (Signed)
-----   Message from [name], MD sent at 01/29/2021  7:00 PM EST ----- Although I spoke to Nathan Porter about referring him for Mohs surgery at the time of the biopsy, the microscopic indicates that this is mostly growing above the skin rather than deep and we will try a fairly simple procedure and only refer to Mohs if this should fail.  Schedule regular surgery with me.

## 2021-02-12 DIAGNOSIS — I10 Essential (primary) hypertension: Secondary | ICD-10-CM | POA: Diagnosis not present

## 2021-02-18 ENCOUNTER — Encounter: Payer: Self-pay | Admitting: Dermatology

## 2021-02-18 NOTE — Progress Notes (Signed)
   Follow-Up Visit   Subjective  Nathan Porter is a 65 y.o. male who presents for the following: Annual Exam (New lesion on left and right ear - scaly, left lower inner ankle- dark mole).  Enlarging crusts on face and ear Location:  Duration:  Quality:  Associated Signs/Symptoms: Modifying Factors:  Severity:  Timing: Context:   Objective  Well appearing patient in no apparent distress; mood and affect are within normal limits. Left Ankle - Anterior 5 mm slightly inflamed and extremely ; Dermoscopy compatible     Right Triangular Fossa 9 mm verrucous crust       Left Buccal Cheek 1 cm verrucous crust       Left Superior Crus of Antihelix, Right Buccal Cheek 5 mm hornlike crusts     Left Forehead Tan flat-topped noninflamed textured 1 cm papule     A focused examination was performed including head and neck. Relevant physical exam findings are noted in the Assessment and Plan.   Assessment & Plan    Inflamed seborrheic keratosis Left Ankle - Anterior   leave if stable  Neoplasm of uncertain behavior of skin (2) Right Triangular Fossa  Skin / nail biopsy Type of biopsy: tangential   Informed consent: discussed and consent obtained   Timeout: patient name, date of birth, surgical site, and procedure verified   Procedure prep:  Patient was prepped and draped in usual sterile fashion (Non sterile) Prep type:  Chlorhexidine Anesthesia: the lesion was anesthetized in a standard fashion   Anesthetic:  1% lidocaine w/ epinephrine 1-100,000 local infiltration Instrument used: flexible razor blade   Hemostasis achieved with: ferric subsulfate   Outcome: patient tolerated procedure well   Post-procedure details: wound care instructions given    Specimen 1 - Surgical pathology Differential Diagnosis: R/O BCC vs SCC  [id number] MOH's if + Check Margins: No  Left Buccal Cheek  Skin / nail biopsy Type of biopsy: tangential   Informed consent:  discussed and consent obtained   Timeout: patient name, date of birth, surgical site, and procedure verified   Procedure prep:  Patient was prepped and draped in usual sterile fashion (Non sterile) Prep type:  Chlorhexidine Anesthesia: the lesion was anesthetized in a standard fashion   Anesthetic:  1% lidocaine w/ epinephrine 1-100,000 local infiltration Instrument used: flexible razor blade   Hemostasis achieved with: ferric subsulfate and electrodesiccation   Outcome: patient tolerated procedure well   Post-procedure details: wound care instructions given    Specimen 2 - Surgical pathology Differential Diagnosis: R/O Wart - cautery after biopsy  Check Margins: No  AK (actinic keratosis) (2) Left Superior Crus of Antihelix; Right Buccal Cheek  Destruction of lesion - Left Superior Crus of Antihelix, Right Buccal Cheek Complexity: simple   Destruction method: cryotherapy   Informed consent: discussed and consent obtained   Timeout:  patient name, date of birth, surgical site, and procedure verified Lesion destroyed using liquid nitrogen: Yes   Cryotherapy cycles:  4 Outcome: patient tolerated procedure well with no complications   Post-procedure details: wound care instructions given    Seborrheic keratosis Left Forehead  Intervention not necessary      I, [name], MD, have reviewed all documentation for this visit.  The documentation on 02/18/21 for the exam, diagnosis, procedures, and orders are all accurate and complete.

## 2021-02-27 DIAGNOSIS — M0579 Rheumatoid arthritis with rheumatoid factor of multiple sites without organ or systems involvement: Secondary | ICD-10-CM | POA: Diagnosis not present

## 2021-04-12 ENCOUNTER — Encounter: Payer: Self-pay | Admitting: Dermatology

## 2021-04-12 ENCOUNTER — Other Ambulatory Visit: Payer: Self-pay

## 2021-04-12 ENCOUNTER — Ambulatory Visit (INDEPENDENT_AMBULATORY_CARE_PROVIDER_SITE_OTHER): Payer: Medicare Other | Admitting: Dermatology

## 2021-04-12 DIAGNOSIS — D0421 Carcinoma in situ of skin of right ear and external auricular canal: Secondary | ICD-10-CM

## 2021-04-12 DIAGNOSIS — D0430 Carcinoma in situ of skin of unspecified part of face: Secondary | ICD-10-CM

## 2021-04-12 NOTE — Patient Instructions (Signed)

## 2021-04-23 ENCOUNTER — Ambulatory Visit: Payer: Medicare Other | Admitting: Dermatology

## 2021-04-23 DIAGNOSIS — N401 Enlarged prostate with lower urinary tract symptoms: Secondary | ICD-10-CM | POA: Diagnosis not present

## 2021-04-23 DIAGNOSIS — N200 Calculus of kidney: Secondary | ICD-10-CM | POA: Diagnosis not present

## 2021-04-23 DIAGNOSIS — Z87442 Personal history of urinary calculi: Secondary | ICD-10-CM | POA: Diagnosis not present

## 2021-04-23 DIAGNOSIS — R972 Elevated prostate specific antigen [PSA]: Secondary | ICD-10-CM | POA: Diagnosis not present

## 2021-04-26 DIAGNOSIS — N202 Calculus of kidney with calculus of ureter: Secondary | ICD-10-CM | POA: Diagnosis not present

## 2021-05-05 ENCOUNTER — Encounter: Payer: Self-pay | Admitting: Dermatology

## 2021-05-05 NOTE — Progress Notes (Signed)
° °  Follow-Up Visit   Subjective  Nathan Porter is a 66 y.o. male who presents for the following: Procedure (Skin , right triangular fossa/SQUAMOUS CELL CARCINOMA IN SITU, HYPERTROPHIC, BASE INVOLVED).  CIS here Location:  Duration:  Quality:  Associated Signs/Symptoms: Modifying Factors:  Severity:  Timing: Context:   Objective  Well appearing patient in no apparent distress; mood and affect are within normal limits. Right Triangular Fossa Lesion identified by Dr.Karigan Cloninger and nurse in room.      A focused examination was performed including head and neck. Relevant physical exam findings are noted in the Assessment and Plan.   Assessment & Plan    Squamous cell carcinoma in situ (SCCIS) of skin of face Right Triangular Fossa  Destruction of lesion Complexity: simple   Destruction method: electrodesiccation and curettage   Informed consent: discussed and consent obtained   Timeout:  patient name, date of birth, surgical site, and procedure verified Anesthesia: the lesion was anesthetized in a standard fashion   Anesthetic:  1% lidocaine w/ epinephrine 1-100,000 local infiltration Curettage performed in three different directions: Yes   Electrodesiccation performed over the curetted area: Yes   Curettage cycles:  3 Lesion length (cm):  1 Lesion width (cm):  1 Margin per side (cm):  0 Final wound size (cm):  1 Hemostasis achieved with:  aluminum chloride, ferric subsulfate and electrodesiccation Outcome: patient tolerated procedure well with no complications   Post-procedure details: wound care instructions given   Additional details:  Wound inoculated with 5% fluorouracil solution  Curettage showed this to be a superficial lesion.      I, [name], MD, have reviewed all documentation for this visit.  The documentation on 05/05/21 for the exam, diagnosis, procedures, and orders are all accurate and complete.

## 2021-05-21 DIAGNOSIS — R5383 Other fatigue: Secondary | ICD-10-CM | POA: Diagnosis not present

## 2021-05-21 DIAGNOSIS — M25552 Pain in left hip: Secondary | ICD-10-CM | POA: Diagnosis not present

## 2021-05-21 DIAGNOSIS — M0579 Rheumatoid arthritis with rheumatoid factor of multiple sites without organ or systems involvement: Secondary | ICD-10-CM | POA: Diagnosis not present

## 2021-05-21 DIAGNOSIS — M150 Primary generalized (osteo)arthritis: Secondary | ICD-10-CM | POA: Diagnosis not present

## 2021-06-25 DIAGNOSIS — H903 Sensorineural hearing loss, bilateral: Secondary | ICD-10-CM | POA: Diagnosis not present

## 2021-06-25 DIAGNOSIS — H9313 Tinnitus, bilateral: Secondary | ICD-10-CM | POA: Diagnosis not present

## 2021-07-16 ENCOUNTER — Encounter: Payer: Self-pay | Admitting: Dermatology

## 2021-07-16 ENCOUNTER — Ambulatory Visit: Payer: Medicare Other | Admitting: Dermatology

## 2021-07-16 DIAGNOSIS — D0439 Carcinoma in situ of skin of other parts of face: Secondary | ICD-10-CM | POA: Diagnosis not present

## 2021-07-16 DIAGNOSIS — D0422 Carcinoma in situ of skin of left ear and external auricular canal: Secondary | ICD-10-CM

## 2021-07-16 DIAGNOSIS — L570 Actinic keratosis: Secondary | ICD-10-CM

## 2021-07-16 DIAGNOSIS — D485 Neoplasm of uncertain behavior of skin: Secondary | ICD-10-CM

## 2021-07-16 DIAGNOSIS — D044 Carcinoma in situ of skin of scalp and neck: Secondary | ICD-10-CM | POA: Diagnosis not present

## 2021-07-16 NOTE — Patient Instructions (Signed)

## 2021-07-16 NOTE — Progress Notes (Signed)
? ?  Follow-Up Visit ?  ?Subjective  ?Nathan Porter is a 66 y.o. male who presents for the following: Skin Problem (New lesion on left upper ear x 3 weeks- scaly and f/u right triangular fossa - treated last office ivist- healing good no concerns). ? ?Patient thought the spot on the left ear might be recurrent but review of records shows 2 previous treatments of right ear.  Several other spots on face to check ?Location:  ?Duration:  ?Quality:  ?Associated Signs/Symptoms: ?Modifying Factors:  ?Severity:  ?Timing: ?Context:  ? ?Objective  ?Well appearing patient in no apparent distress; mood and affect are within normal limits. ?Right Temporal Scalp ?Gritty 6 mm flat crust ? ?Left Triangular Fossa ?Elongated hypertrophic 1 cm crust ? ? ? ? ? ? ?Left Submandibular Area ?Waxy pink 1.3 cm flat lesion ? ? ? ? ? ? ? ? ?A focused examination was performed including head and neck.. Relevant physical exam findings are noted in the Assessment and Plan. ? ? ?Assessment & Plan  ? ? ?AK (actinic keratosis) ?Right Temporal Scalp ? ?Destruction of lesion - Right Temporal Scalp ?Complexity: simple   ?Destruction method: cryotherapy   ?Informed consent: discussed and consent obtained   ?Timeout:  patient name, date of birth, surgical site, and procedure verified ?Lesion destroyed using liquid nitrogen: Yes   ?Cryotherapy cycles:  3 ?Outcome: patient tolerated procedure well with no complications   ? ?Neoplasm of uncertain behavior of skin (2) ?Left Triangular Fossa ? ?Skin / nail biopsy ?Type of biopsy: tangential   ?Informed consent: discussed and consent obtained   ?Timeout: patient name, date of birth, surgical site, and procedure verified   ?Procedure prep:  Patient was prepped and draped in usual sterile fashion (Non sterile) ?Prep type:  Chlorhexidine ?Anesthesia: the lesion was anesthetized in a standard fashion   ?Anesthetic:  1% lidocaine w/ epinephrine 1-100,000 local infiltration ?Instrument used: flexible razor blade    ?Outcome: patient tolerated procedure well   ?Post-procedure details: wound care instructions given   ? ?Specimen 1 - Surgical pathology ?Differential Diagnosis: bcc vs scc ? ?Check Margins: No ? ?Left Submandibular Area ? ?Skin / nail biopsy ?Type of biopsy: tangential   ?Informed consent: discussed and consent obtained   ?Timeout: patient name, date of birth, surgical site, and procedure verified   ?Procedure prep:  Patient was prepped and draped in usual sterile fashion (Non sterile) ?Prep type:  Chlorhexidine ?Anesthesia: the lesion was anesthetized in a standard fashion   ?Anesthetic:  1% lidocaine w/ epinephrine 1-100,000 local infiltration ?Instrument used: flexible razor blade   ?Outcome: patient tolerated procedure well   ?Post-procedure details: wound care instructions given   ? ?Specimen 2 - Surgical pathology ?Differential Diagnosis: bcc vs scc ? ?Check Margins: No ? ? ? ? ? ?I, [name], MD, have reviewed all documentation for this visit.  The documentation on 07/16/21 for the exam, diagnosis, procedures, and orders are all accurate and complete. ?

## 2021-07-18 ENCOUNTER — Telehealth: Payer: Self-pay

## 2021-07-18 NOTE — Telephone Encounter (Signed)
Phone call from patient returning our call for his pathology results. Patient aware of results, surgery scheduled.  ?

## 2021-07-18 NOTE — Telephone Encounter (Signed)
-----   Message from [name], MD sent at 07/18/2021  6:19 AM EDT ----- ?Schedule surgery with Dr. Darene Lamer ?

## 2021-08-21 DIAGNOSIS — Z79899 Other long term (current) drug therapy: Secondary | ICD-10-CM | POA: Diagnosis not present

## 2021-08-21 DIAGNOSIS — M0579 Rheumatoid arthritis with rheumatoid factor of multiple sites without organ or systems involvement: Secondary | ICD-10-CM | POA: Diagnosis not present

## 2021-09-27 ENCOUNTER — Ambulatory Visit (INDEPENDENT_AMBULATORY_CARE_PROVIDER_SITE_OTHER): Payer: Medicare Other | Admitting: Dermatology

## 2021-09-27 DIAGNOSIS — D0439 Carcinoma in situ of skin of other parts of face: Secondary | ICD-10-CM

## 2021-09-27 DIAGNOSIS — D0422 Carcinoma in situ of skin of left ear and external auricular canal: Secondary | ICD-10-CM | POA: Diagnosis not present

## 2021-09-27 DIAGNOSIS — D049 Carcinoma in situ of skin, unspecified: Secondary | ICD-10-CM

## 2021-09-27 NOTE — Patient Instructions (Signed)

## 2021-10-08 ENCOUNTER — Other Ambulatory Visit: Payer: Self-pay | Admitting: Cardiology

## 2021-10-08 DIAGNOSIS — I10 Essential (primary) hypertension: Secondary | ICD-10-CM

## 2021-10-20 ENCOUNTER — Encounter: Payer: Self-pay | Admitting: Dermatology

## 2021-10-20 NOTE — Progress Notes (Signed)
   Follow-Up Visit   Subjective  Nathan Porter is a 66 y.o. male who presents for the following: Procedure (Here for treatment on left triangular fossa and left submandibular. Both CIS.).  2 Biopsy-proven carcinoma in situ  Location:  Duration:  Quality:  Associated Signs/Symptoms: Modifying Factors:  Severity:  Timing: Context:   Objective  Well appearing patient in no apparent distress; mood and affect are within normal limits. Left triangular fossa Lesion identified by nurse and Dr. Denna Haggard in room [id number]  Left submandibular area Lesion identified by nurse and Dr. Denna Haggard in room.  [phone]    A focused examination was performed including the head and neck. Relevant physical exam findings are noted in the Assessment and Plan.   Assessment & Plan    Squamous cell carcinoma in situ (SCCIS) of skin (2) Left triangular fossa  Destruction of lesion Complexity: simple   Destruction method: electrodesiccation and curettage   Informed consent: discussed and consent obtained   Timeout:  patient name, date of birth, surgical site, and procedure verified Anesthesia: the lesion was anesthetized in a standard fashion   Anesthetic:  1% lidocaine w/ epinephrine 1-100,000 local infiltration Curettage performed in three different directions: Yes   Curettage cycles:  3 Lesion length (cm):  1 Lesion width (cm):  1 Margin per side (cm):  0 Final wound size (cm):  1 Hemostasis achieved with:  aluminum chloride and ferric subsulfate Outcome: patient tolerated procedure well with no complications   Post-procedure details: wound care instructions given   Additional details:  Wound innoculated with 5 fluorouracil solution.  Left submandibular area  Destruction of lesion Complexity: simple   Destruction method: electrodesiccation and curettage   Informed consent: discussed and consent obtained   Timeout:  patient name, date of birth, surgical site, and procedure  verified Anesthesia: the lesion was anesthetized in a standard fashion   Anesthetic:  1% lidocaine w/ epinephrine 1-100,000 local infiltration Curettage performed in three different directions: Yes   Curettage cycles:  3 Lesion length (cm):  1.5 Lesion width (cm):  1.5 Margin per side (cm):  0 Final wound size (cm):  1.5 Hemostasis achieved with:  ferric subsulfate Outcome: patient tolerated procedure well with no complications   Additional details:  Wound innoculated with 5 fluorouracil solution.      I, [name], MD, have reviewed all documentation for this visit.  The documentation on 10/20/21 for the exam, diagnosis, procedures, and orders are all accurate and complete.

## 2021-11-13 NOTE — Progress Notes (Unsigned)
Primary Physician/Referring:  Crist Infante, MD  Patient ID: Nathan Porter, male    DOB: Jun 08, 1955, 66 y.o.   MRN: [id number]  No chief complaint on file.  HPI:    Nathan Porter  is a 66 y.o. Caucasian male with hypertension, hyperlipidemia, aortic and coronary atherosclerosis without angina noted on the CT scan for lung cancer screening in August 2020, tobacco use disorder presents here for 68-monthoffice visit for management of his risk factors.  Patient is still smoking cigarettes.  No specific complaints today and denies dyspnea except for cough no other symptoms.   Past Medical History:  Diagnosis Date   Ar[place]    [place]   [place]   Hip discomfort    History of kidney stones    Hyperlipidemia    Hypertension    Squamous cell carcinoma of skin 12/01/2019   in situ-right triangular fossa   Past Surgical History:  Procedure Laterality Date   CYSTOSCOPY     HEMORRHOID BANDING  2019   LITHOTRIPSY     LYMPHADENECTOMY  03/2018   TOTAL HIP ARTHROPLASTY Right 08/13/2019   Procedure: TOTAL HIP ARTHROPLASTY;  Surgeon: CEarlie Server MD;  Location: [facility];  Service: Orthopedics;  Laterality: Right;   Family History  Problem Relation Age of Onset   Lung disease Mother        Tumor   CAD Father        S/pCABG    Social History   Tobacco Use   Smoking status: Every Day    Packs/day: 1.00    Years: 45.00    Total pack years: 45.00    Types: Cigarettes   Smokeless tobacco: Never   Tobacco comments:    Pt smokes 1  pack  of cigarettes a day 09/13/20 [facility]   Substance Use Topics   Alcohol use: Not Currently   Marital Status: Married  ROS  Review of Systems  Cardiovascular:  Negative for chest pain, dyspnea on exertion and leg swelling.  Respiratory:  Positive for cough.   Gastrointestinal:  Negative for melena.   Objective  There were no vitals taken for this visit.     11/07/2020    3:12 PM  09/13/2020    4:39 PM 05/08/2020    8:30 AM  Vitals with BMI  Height '5\' 8"'  '5\' 8"'  '5\' 9"'   Weight 186 lbs 192 lbs 207 lbs  BMI 28.29 295.0393.26 Systolic [id number] Diastolic 71 68 76  Pulse 69 72 77     Physical Exam Neck:     Vascular: No carotid bruit or JVD.  Cardiovascular:     Rate and Rhythm: Normal rate and regular rhythm.     Pulses: Normal pulses and intact distal pulses.     Heart sounds: Normal heart sounds. No murmur heard.    No gallop.  Pulmonary:     Effort: Pulmonary effort is normal.     Breath sounds: Normal air entry. Examination of the right-lower field reveals rales. Rales present.  Abdominal:     General: Bowel sounds are normal.     Palpations: Abdomen is soft.  Musculoskeletal:        General: No swelling.    Laboratory examination:  External labs:  Labs 01/05/2021:  Serum glucose 91 mg, BUN 20, creatinine 0.8, EGFR 84 mL, potassium 4.8, LFTs normal.  Hb 15.7/HCT 44.3, platelets 290, normal indicis.  Total cholesterol 195,  triglycerides 45, HDL 37, LDL 59.  Non-HDL cholesterol 68.  TSH normal at 1.53.  Allergies  No Known Allergies   Final Medications at End of Visit    Current Outpatient Medications:    albuterol (VENTOLIN HFA) 108 (90 Base) MCG/ACT inhaler, Inhale 2 puffs into the lungs every 6 (six) hours as needed for wheezing or shortness of breath., Disp: 8 g, Rfl: 6   amLODipine (NORVASC) 5 MG tablet, TAKE 1 TABLET BY MOUTH EVERY DAY, Disp: 90 tablet, Rfl: 3   folic acid (FOLVITE) 1 MG tablet, Take 1 mg by mouth daily., Disp: , Rfl:    HYDROcodone-acetaminophen (NORCO/VICODIN) 5-325 MG tablet, Take 1 tab po q4-6hrs prn pain, may need 1-2 first couple weeks on occasion, max 8 tabs per day., Disp: 40 tablet, Rfl: 0   losartan (COZAAR) 25 MG tablet, Take 25 mg by mouth at bedtime., Disp: , Rfl:    methotrexate 2.5 MG tablet, Take 15 mg by mouth once a week., Disp: , Rfl:    polycarbophil (FIBERCON) 625 MG tablet, Take 1,250 mg by mouth  daily., Disp: , Rfl:    promethazine (PHENERGAN) 25 MG tablet, Take 25 mg by mouth 2 (two) times daily as needed (nausea associated with kidney stones.). , Disp: , Rfl:    rosuvastatin (CRESTOR) 20 MG tablet, Take 20 mg by mouth at bedtime., Disp: , Rfl:    tamsulosin (FLOMAX) 0.4 MG CAPS capsule, Take 2 capsules by mouth at bedtime., Disp: , Rfl:    traMADol (ULTRAM) 50 MG tablet, Take 50 mg by mouth 2 (two) times daily as needed (pain associated with kidney stones.). , Disp: , Rfl:    triamcinolone (NASACORT) 55 MCG/ACT AERO nasal inhaler, Place 2 sprays into the nose daily., Disp: , Rfl:    Radiology:   CT chest 11/10/2018: 1. Lung-RADS 2S, benign appearance or behavior. Continue annual screening with low-dose chest CT without contrast in 12 months. 2. The "S" modifier above refers to potentially clinically significant non lung cancer related findings. Specifically, there is aortic atherosclerosis, in addition to left main and 3 vessel coronary artery disease. Please note that although the presence of coronary artery calcium documents the presence of coronary artery disease, the severity of this disease and any potential stenosis cannot be assessed on this non-gated CT examination. Assessment for potential risk factor modification, dietary therapy or pharmacologic therapy may be warranted, if clinically indicated. 3. Mild diffuse bronchial wall thickening with mild to moderate centrilobular and mild paraseptal emphysema; imaging findings suggestive of underlying COPD.   Aortic Atherosclerosis (ICD10-I70.0) and Emphysema (ICD10-J43.9).  Cardiac Studies:   Abdominal Aortic Duplex  01/08/2019: No AAA observed.  The maximum aorta (sac) diameter is 2.24 cm (mid). Diffuse plaque observed in the mid aorta.  Mild increase in left internal iliac artery velocity and suggests <50% stenosis, with normal triphasic wave pattern.   PCV CARDIAC STRESS TEST 05/15/2020 Exercise treadmill stress  test performed using Bruce protocol.  Patient reached 7.2 METS, and 93% of age predicted maximum heart rate.  Exercise capacity was low normal.  No chest pain reported.  Normal heart rate and hemodynamic response. Stress EKG revealed no ischemic changes. Low risk study.  PCV ECHOCARDIOGRAM COMPLETE 05/23/2020 Left ventricle cavity is normal in size and wall thickness. Normal global wall motion. Normal LV systolic function with EF 67%. Normal diastolic filling pattern. No significant valvular abnormality. normal right atrial pressure.   EKG   EKG 11/07/2020: Normal sinus rhythm at rate of 67 bpm,  left atrial enlargement, leftward axis.  Incomplete right bundle branch block.  No evidence of ischemia.  No significant change from 05/08/2020.  Assessment     ICD-10-CM   1. Coronary artery calcification seen on CAT scan  I25.10     2. Dyspnea on exertion  R06.09     3. Essential hypertension  I10     4. Hypercholesteremia  E78.00      No orders of the defined types were placed in this encounter.   There are no discontinued medications.  No orders of the defined types were placed in this encounter.   Recommendations:   MESHULEM ONORATO  is a  66 y.o. Caucasian male with hypertension, hyperlipidemia, aortic and coronary atherosclerosis without angina noted on the CT scan for lung cancer screening in August 2020, tobacco use disorder presents here for 65-monthoffice visit for management of his risk factors.  He continues to remain asymptomatic and denies any dyspnea or chest pain although he does have chronic cough.  Advised him to try over-the-counter cough suppressants and also to try PPI.  Smoking cessation discussed.  I reviewed his external labs, LDL is now at goal.  He is tolerating all his medications well.  Blood pressure is also well controlled, he has also lost close to 22 pounds in weight with diet and exercise.  Congratulated him.  No changes in the medications were done today.  I  will see him back in a year and if he remains stable on a as needed basis.     JAdrian Prows MD, F[facility]8/29/2023, 6:49 PM Office: [phone]

## 2021-11-14 ENCOUNTER — Ambulatory Visit: Payer: Medicare Other | Admitting: Cardiology

## 2021-11-14 ENCOUNTER — Encounter: Payer: Self-pay | Admitting: Cardiology

## 2021-11-14 VITALS — BP 119/72 | HR 76 | Temp 98.7°F | Resp 16 | Ht 68.0 in | Wt 194.8 lb

## 2021-11-14 DIAGNOSIS — I10 Essential (primary) hypertension: Secondary | ICD-10-CM

## 2021-11-14 DIAGNOSIS — I2510 Atherosclerotic heart disease of native coronary artery without angina pectoris: Secondary | ICD-10-CM | POA: Diagnosis not present

## 2021-11-14 DIAGNOSIS — R0609 Other forms of dyspnea: Secondary | ICD-10-CM | POA: Diagnosis not present

## 2021-11-14 DIAGNOSIS — E7800 Pure hypercholesterolemia, unspecified: Secondary | ICD-10-CM | POA: Diagnosis not present

## 2021-11-14 MED ORDER — ASPIRIN 81 MG PO TBEC: 81.0000 mg | DELAYED_RELEASE_TABLET | Freq: Every day | ORAL | 3 refills | Status: AC

## 2021-11-20 DIAGNOSIS — M0579 Rheumatoid arthritis with rheumatoid factor of multiple sites without organ or systems involvement: Secondary | ICD-10-CM | POA: Diagnosis not present

## 2021-11-20 DIAGNOSIS — M1991 Primary osteoarthritis, unspecified site: Secondary | ICD-10-CM | POA: Diagnosis not present

## 2021-11-20 DIAGNOSIS — R5383 Other fatigue: Secondary | ICD-10-CM | POA: Diagnosis not present

## 2021-11-20 DIAGNOSIS — M25552 Pain in left hip: Secondary | ICD-10-CM | POA: Diagnosis not present

## 2021-11-24 IMAGING — CT CT FEMUR *R* W/O CM
4 of 7 series · 12 of 33 positions shown, 13 images · non-contrast
Comparison: None.

CLINICAL DATA: Right femur pain.  Evaluate for occult fracture.

EXAM:
CT OF THE LOWER RIGHT EXTREMITY WITHOUT CONTRAST
TECHNIQUE: Multidetector CT imaging of the right lower extremity was performed
according to the standard protocol.

[Series 5: axial bone · axial · 0.48mm/px · z∈[+790,+1074]mm · 3 of 379 slices shown, 4 images]
[im 95/379  soft-tissue]
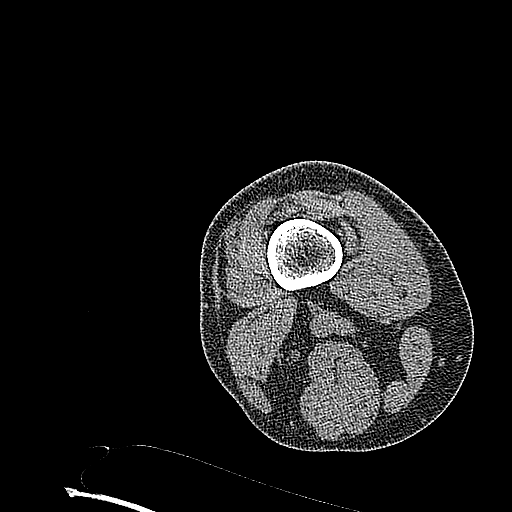
[im 95/379  bone]
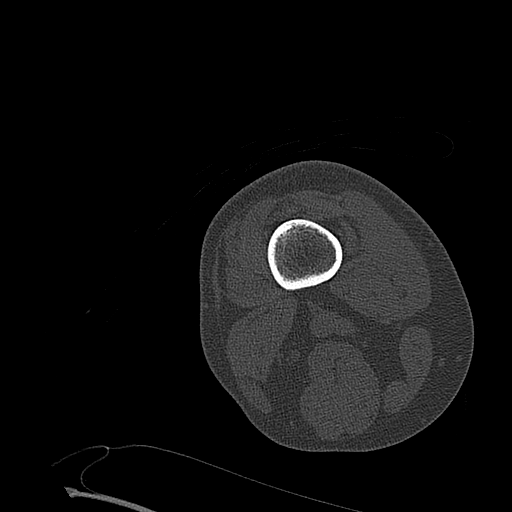
[im 190/379  bone]
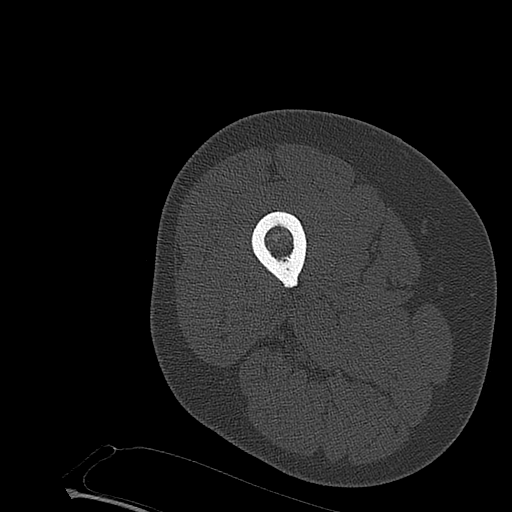
[im 284/379  bone]
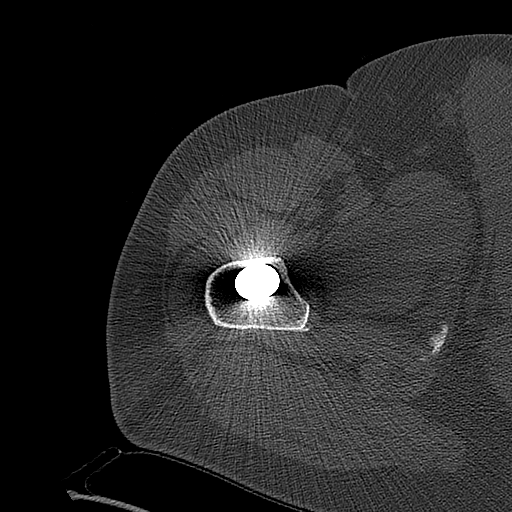

[Series 6: coronal bone · coronal · 0.54mm/px · 1 of 155 slices shown]
[im 78/155  bone]
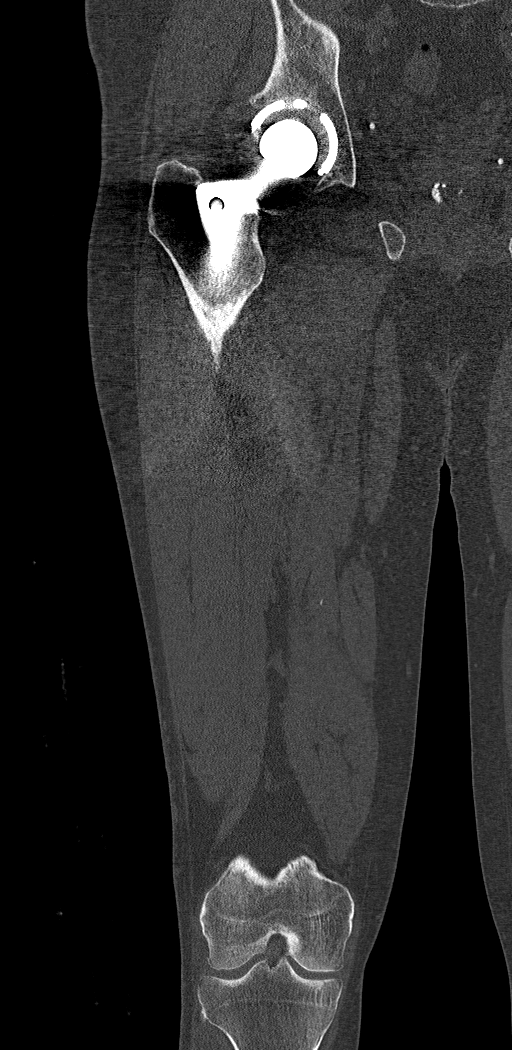

[Series 7: sagittal bone · sagittal · 0.51mm/px · 5 of 125 slices shown]
[im 21/125  bone]
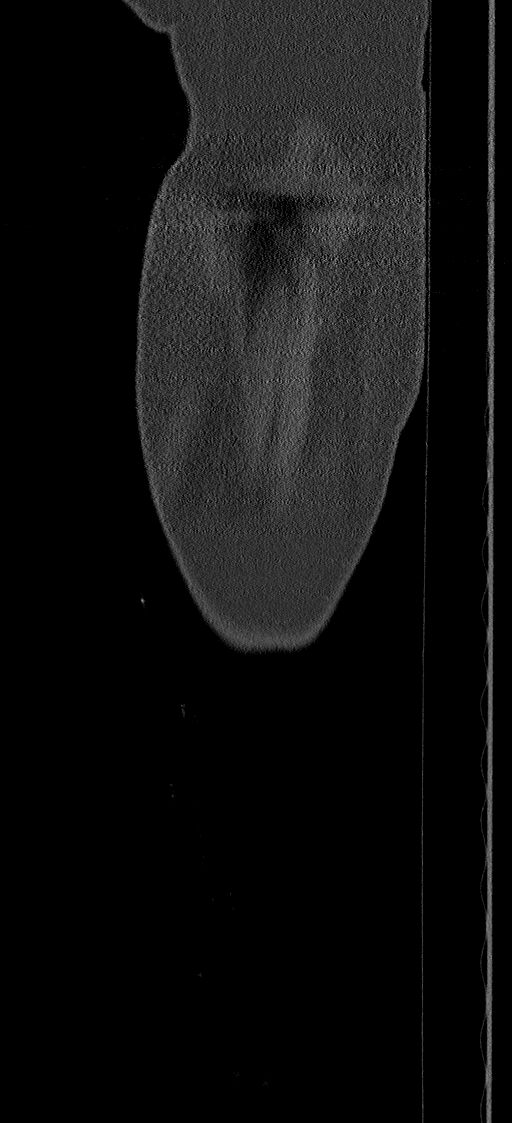
[im 42/125  bone]
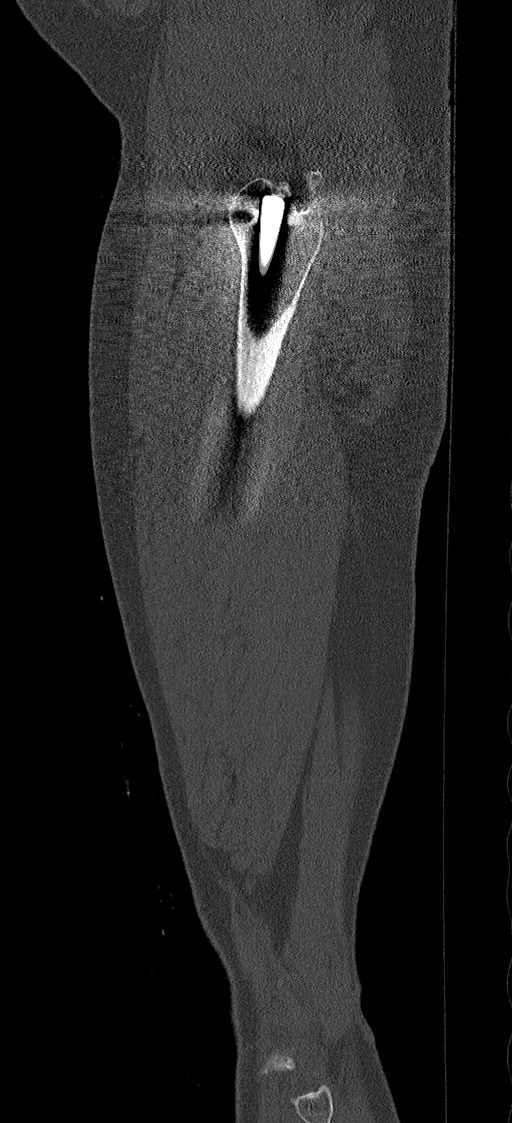
[im 63/125  bone]
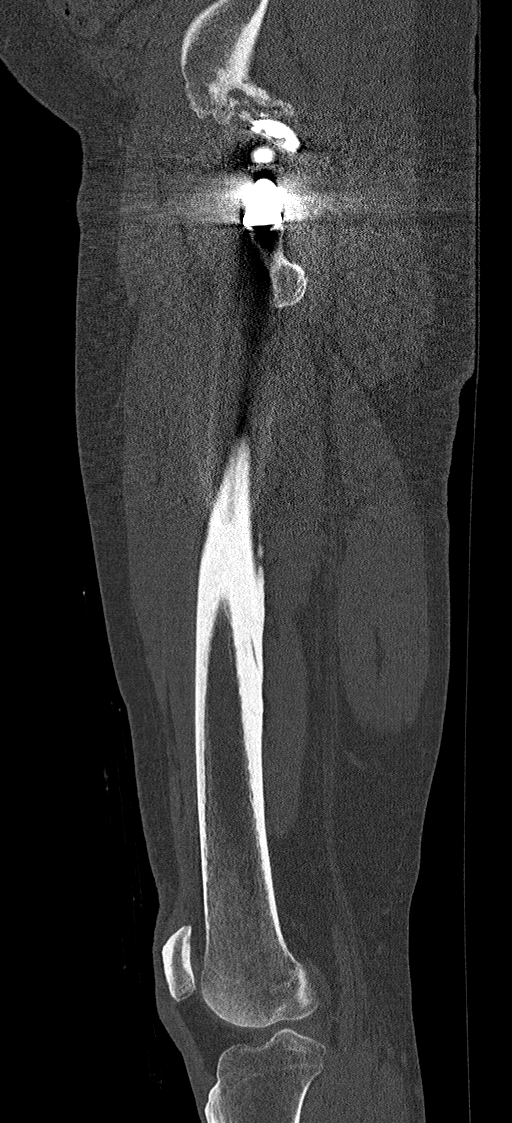
[im 83/125  bone]
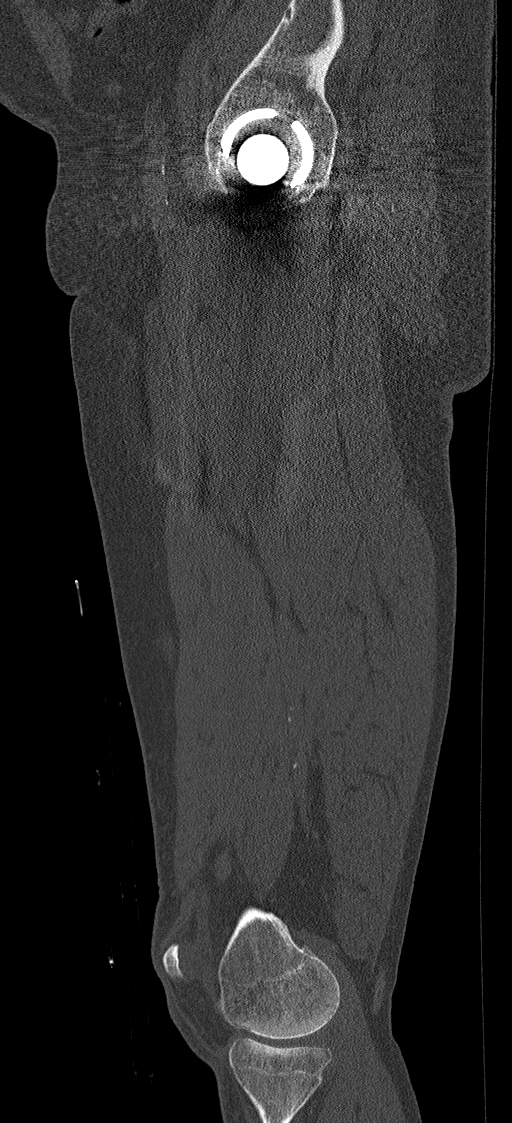
[im 104/125  bone]
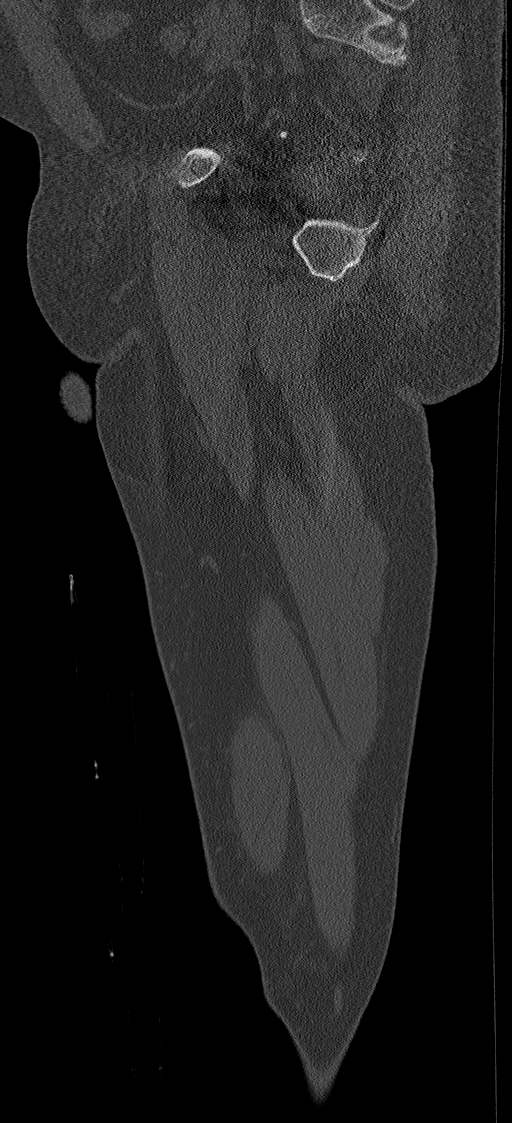

[Series 10: thins st · axial · 0.48mm/px · z∈[+790,+1074]mm · 3 of 379 slices shown]
[im 95/379  bone]
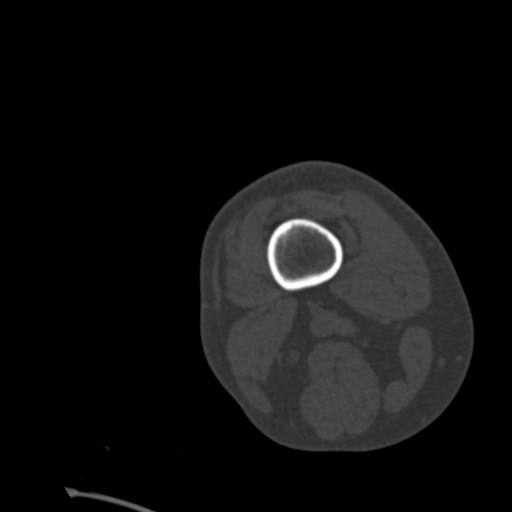
[im 190/379  bone]
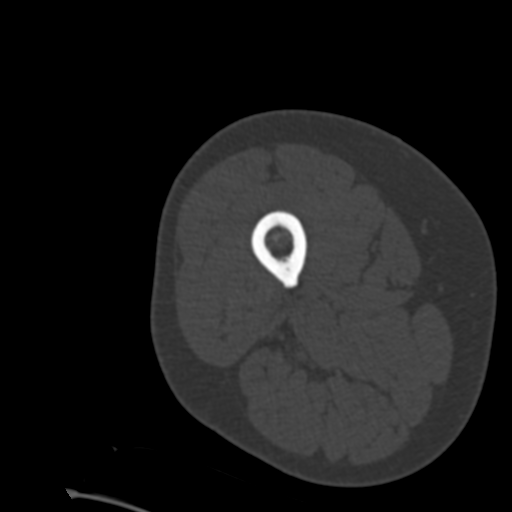
[im 284/379  bone]
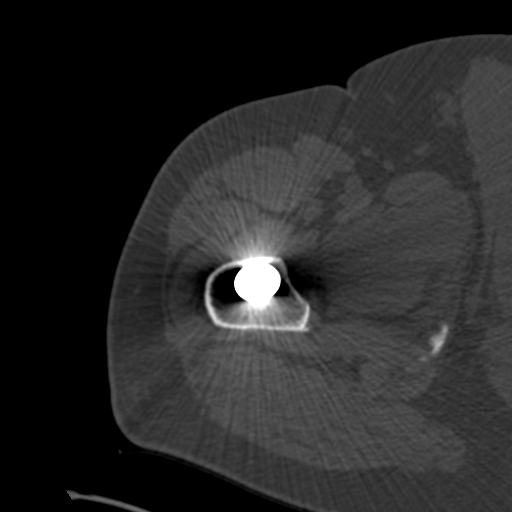

[12 of 33 positions shown; findings below may reference images not displayed]

FINDINGS: Bones/Joint/Cartilage

Right hip arthroplasty without hardware failure or complication. No
periarticular fluid collection or hematoma. No fracture or
dislocation. Normal alignment. No joint effusion.

Ligaments

Ligaments are suboptimally evaluated by CT.

Muscles and Tendons
Muscles are normal. No muscle atrophy. No intramuscular fluid
collection or hematoma. Patellar tendon and quadriceps tendon are
intact.

Soft tissue
No fluid collection or hematoma. No soft tissue mass. Peripheral
vascular atherosclerotic disease.
IMPRESSION: 1. Right hip arthroplasty without hardware failure or complication.
2. No acute osseous injury of the right hip.

## 2021-12-23 ENCOUNTER — Encounter (HOSPITAL_BASED_OUTPATIENT_CLINIC_OR_DEPARTMENT_OTHER): Payer: Self-pay | Admitting: Emergency Medicine

## 2021-12-23 ENCOUNTER — Other Ambulatory Visit: Payer: Self-pay

## 2021-12-23 ENCOUNTER — Emergency Department (HOSPITAL_BASED_OUTPATIENT_CLINIC_OR_DEPARTMENT_OTHER): Payer: Medicare Other

## 2021-12-23 ENCOUNTER — Emergency Department (HOSPITAL_BASED_OUTPATIENT_CLINIC_OR_DEPARTMENT_OTHER)
Admission: EM | Admit: 2021-12-23 | Discharge: 2021-12-23 | Disposition: A | Discharge status: Home / Self Care | Payer: Medicare Other | Attending: Emergency Medicine | Admitting: Emergency Medicine

## 2021-12-23 DIAGNOSIS — K5730 Diverticulosis of large intestine without perforation or abscess without bleeding: Secondary | ICD-10-CM | POA: Diagnosis not present

## 2021-12-23 DIAGNOSIS — Z85828 Personal history of other malignant neoplasm of skin: Secondary | ICD-10-CM | POA: Diagnosis not present

## 2021-12-23 DIAGNOSIS — J449 Chronic obstructive pulmonary disease, unspecified: Secondary | ICD-10-CM | POA: Diagnosis not present

## 2021-12-23 DIAGNOSIS — I10 Essential (primary) hypertension: Secondary | ICD-10-CM | POA: Insufficient documentation

## 2021-12-23 DIAGNOSIS — N201 Calculus of ureter: Secondary | ICD-10-CM | POA: Diagnosis not present

## 2021-12-23 DIAGNOSIS — N134 Hydroureter: Secondary | ICD-10-CM | POA: Diagnosis not present

## 2021-12-23 DIAGNOSIS — F17200 Nicotine dependence, unspecified, uncomplicated: Secondary | ICD-10-CM | POA: Insufficient documentation

## 2021-12-23 DIAGNOSIS — N132 Hydronephrosis with renal and ureteral calculous obstruction: Secondary | ICD-10-CM | POA: Diagnosis not present

## 2021-12-23 DIAGNOSIS — R109 Unspecified abdominal pain: Secondary | ICD-10-CM | POA: Diagnosis present

## 2021-12-23 LAB — COMPREHENSIVE METABOLIC PANEL
ALT: 28 U/L (ref 0–44)
AST: 25 U/L (ref 15–41)
Albumin: 4.2 g/dL (ref 3.5–5.0)
Alkaline Phosphatase: 75 U/L (ref 38–126)
Anion gap: 9 (ref 5–15)
BUN: 18 mg/dL (ref 8–23)
CO2: 25 mmol/L (ref 22–32)
Calcium: 9.4 mg/dL (ref 8.9–10.3)
Chloride: 103 mmol/L (ref 98–111)
Creatinine, Ser: 0.98 mg/dL (ref 0.61–1.24)
GFR, Estimated: >60 mL/min (ref >60)
Glucose, Bld: 101 mg/dL — ABNORMAL HIGH (ref 70–99)
Potassium: 4 mmol/L (ref 3.5–5.1)
Sodium: 137 mmol/L (ref 135–145)
Total Bilirubin: 0.4 mg/dL (ref 0.3–1.2)
Total Protein: 7.2 g/dL (ref 6.5–8.1)

## 2021-12-23 LAB — CBC WITH DIFFERENTIAL/PLATELET
Abs Immature Granulocytes: 0.06 10*3/uL (ref 0.00–0.07)
Basophils Absolute: 0 10*3/uL (ref 0.0–0.1)
Basophils Relative: 0 %
Eosinophils Absolute: 0.3 10*3/uL (ref 0.0–0.5)
Eosinophils Relative: 3 %
HCT: 42 % (ref 39.0–52.0)
Hemoglobin: 14.8 g/dL (ref 13.0–17.0)
Immature Granulocytes: 1 %
Lymphocytes Relative: 16 %
Lymphs Abs: 1.5 10*3/uL (ref 0.7–4.0)
MCH: 33 pg (ref 26.0–34.0)
MCHC: 35.2 g/dL (ref 30.0–36.0)
MCV: 93.8 fL (ref 80.0–100.0)
Monocytes Absolute: 0.6 10*3/uL (ref 0.1–1.0)
Monocytes Relative: 6 %
Neutro Abs: 6.9 10*3/uL (ref 1.7–7.7)
Neutrophils Relative %: 74 %
Platelets: 277 10*3/uL (ref 150–400)
RBC: 4.48 MIL/uL (ref 4.22–5.81)
RDW: 14.7 % (ref 11.5–15.5)
WBC: 9.3 10*3/uL (ref 4.0–10.5)
nRBC: 0 % (ref 0.0–0.2)

## 2021-12-23 LAB — URINALYSIS, ROUTINE W REFLEX MICROSCOPIC
Bilirubin Urine: NEGATIVE
Glucose, UA: NEGATIVE mg/dL
Ketones, ur: NEGATIVE mg/dL
Nitrite: NEGATIVE
Protein, ur: 30 mg/dL — AB
Specific Gravity, Urine: 1.018 (ref 1.005–1.030)
pH: 6 (ref 5.0–8.0)

## 2021-12-23 MED ORDER — ONDANSETRON 4 MG PO TBDP: 4.0000 mg | ORAL_TABLET | Freq: Three times a day (TID) | ORAL | 0 refills | Status: DC | PRN

## 2021-12-23 MED ORDER — KETOROLAC TROMETHAMINE 15 MG/ML IJ SOLN
15.0000 mg | Freq: Once | INTRAMUSCULAR | Status: AC
  Administered 2021-12-23: 15 mg via INTRAVENOUS
  Filled 2021-12-23 (×2): qty 1

## 2021-12-23 MED ORDER — ONDANSETRON HCL 4 MG/2ML IJ SOLN
4.0000 mg | Freq: Once | INTRAMUSCULAR | Status: AC
  Administered 2021-12-23: 4 mg via INTRAVENOUS
  Filled 2021-12-23: qty 2

## 2021-12-23 MED ORDER — SODIUM CHLORIDE 0.9 % IV BOLUS
500.0000 mL | Freq: Once | INTRAVENOUS | Status: AC
  Administered 2021-12-23: 500 mL via INTRAVENOUS

## 2021-12-23 MED ORDER — TAMSULOSIN HCL 0.4 MG PO CAPS: 0.4000 mg | ORAL_CAPSULE | Freq: Every day | ORAL | 0 refills | Status: AC

## 2021-12-23 MED ORDER — OXYCODONE-ACETAMINOPHEN 5-325 MG PO TABS: 1.0000 | ORAL_TABLET | Freq: Four times a day (QID) | ORAL | 0 refills | Status: DC | PRN

## 2021-12-23 NOTE — ED Notes (Signed)
Pt reports that pain has improved, does not wish to take pain medication at this time.

## 2021-12-23 NOTE — ED Triage Notes (Signed)
Pt arrived POV. Pt caox4 and ambulatory. Pt c/o left sided flank pain that started approx 2 hours. Hx of kidney stones. Pt also c/o pain in testicles and nausea, no vomiting.

## 2021-12-23 NOTE — ED Provider Notes (Signed)
Emergency Department Provider Note   I have reviewed the triage vital signs and the nursing notes.   HISTORY  Chief Complaint Flank Pain   HPI Nathan Porter is a 66 y.o. male with past history reviewed below including multiple prior kidney stones presents with similar left flank pain typical of his kidney stone pain.  Symptoms began 2 hours prior to ED presentation.  He states began with a discomfort in the testicles which is common for him followed by more severe, intermittent left flank pain along with nausea.  No fevers or chills.  No dysuria.  No pain into the chest.  He does follow with urology and has required intervention on stones in the past although typically is able to pass them on his own.   Past Medical History:  Diagnosis Date   Arthritis    Cancer ([place]) 2019   skin   COPD (chronic obstructive pulmonary disease) ([place]) 2020   Hip discomfort    History of kidney stones    Hyperlipidemia    Hypertension    Squamous cell carcinoma of skin 12/01/2019   in situ-right triangular fossa    Review of Systems  Constitutional: No fever/chills Eyes: No visual changes. ENT: No sore throat. Cardiovascular: Denies chest pain. Respiratory: Denies shortness of breath. Gastrointestinal: Positive left flank/abdominal pain.  Positive nausea, no vomiting.  No diarrhea.  No constipation. Genitourinary: Negative for dysuria. Musculoskeletal: Negative for back pain. Skin: Negative for rash. Neurological: Negative for headaches, focal weakness or numbness.  ____________________________________________   PHYSICAL EXAM:  VITAL SIGNS: ED Triage Vitals  Enc Vitals Group     BP 12/23/21 1818 (!) 153/89     Pulse Rate 12/23/21 1818 72     Resp 12/23/21 1818 20     Temp 12/23/21 1818 98.1 F (36.7 C)     Temp Source 12/23/21 1818 Oral     SpO2 12/23/21 1818 98 %     Weight 12/23/21 1819 200 lb (90.7 kg)     Height 12/23/21 1819 '5\' 8"'$  (1.727 m)   Constitutional: Alert  and oriented. Patient appears uncomfortable but able to provide a full history.  Eyes: Conjunctivae are normal.  Head: Atraumatic. Nose: No congestion/rhinnorhea. Mouth/Throat: Mucous membranes are moist.   Neck: No stridor.   Cardiovascular: Normal rate, regular rhythm. Good peripheral circulation. Grossly normal heart sounds.   Respiratory: Normal respiratory effort.  No retractions. Lungs CTAB. Gastrointestinal: Soft and nontender. No distention.  Musculoskeletal:  No gross deformities of extremities. Neurologic:  Normal speech and language. Skin:  Skin is warm, dry and intact. No rash noted.   ____________________________________________   LABS (all labs ordered are listed, but only abnormal results are displayed)  Labs Reviewed  URINE CULTURE  COMPREHENSIVE METABOLIC PANEL  CBC WITH DIFFERENTIAL/PLATELET  URINALYSIS, ROUTINE W REFLEX MICROSCOPIC   ____________________________________________  EKG  *** ____________________________________________  RADIOLOGY  No results found.  ____________________________________________   PROCEDURES  Procedure(s) performed:   Procedures   ____________________________________________   INITIAL IMPRESSION / ASSESSMENT AND PLAN / ED COURSE  Pertinent labs & imaging results that were available during my care of the patient were reviewed by me and considered in my medical decision making (see chart for details).   This patient is Presenting for Evaluation of flank pain, which does require a range of treatment options, and is a complaint that involves a high risk of morbidity and mortality.  The Differential Diagnoses includes but is not exclusive to acute appendicitis, renal colic, testicular  torsion, urinary tract infection, prostatitis,  diverticulitis, small bowel obstruction, colitis, abdominal aortic aneurysm, gastroenteritis, constipation etc.   Critical Interventions-    Medications  ketorolac (TORADOL) 15 MG/ML  injection 15 mg (has no administration in time range)    Reassessment after intervention:     Clinical Laboratory Tests Ordered, included ***  Radiologic Tests Ordered, included ***. I independently interpreted the images and agree with radiology interpretation.   Cardiac Monitor Tracing which shows NSR.   Social Determinants of Health Risk patient is a smoker.   Consult complete with  Medical Decision Making: Summary:  Patient presents emergency department left flank pain typical of prior kidney stones in the past.  No focal anterior abdominal tenderness on exam.  Afebrile here.  Plan for pain control with lower dose Toradol and reassess after CT renal and labs including UA and culture.  Reevaluation with update and discussion with   ***Considered admission***  Disposition:   ____________________________________________  FINAL CLINICAL IMPRESSION(S) / ED DIAGNOSES  Final diagnoses:  None     NEW OUTPATIENT MEDICATIONS STARTED DURING THIS VISIT:  New Prescriptions   No medications on file    Note:  This document was prepared using Dragon voice recognition software and may include unintentional dictation errors.  Nanda Quinton, MD, [facility] Emergency Medicine

## 2021-12-23 NOTE — Discharge Instructions (Signed)

## 2021-12-25 LAB — URINE CULTURE: Culture: NO GROWTH

## 2022-01-14 DIAGNOSIS — I2510 Atherosclerotic heart disease of native coronary artery without angina pectoris: Secondary | ICD-10-CM | POA: Diagnosis not present

## 2022-01-14 DIAGNOSIS — R7989 Other specified abnormal findings of blood chemistry: Secondary | ICD-10-CM | POA: Diagnosis not present

## 2022-01-14 DIAGNOSIS — Z125 Encounter for screening for malignant neoplasm of prostate: Secondary | ICD-10-CM | POA: Diagnosis not present

## 2022-01-21 ENCOUNTER — Other Ambulatory Visit: Payer: Self-pay | Admitting: Internal Medicine

## 2022-01-21 DIAGNOSIS — F17200 Nicotine dependence, unspecified, uncomplicated: Secondary | ICD-10-CM | POA: Diagnosis not present

## 2022-01-21 DIAGNOSIS — E785 Hyperlipidemia, unspecified: Secondary | ICD-10-CM

## 2022-01-21 DIAGNOSIS — R82998 Other abnormal findings in urine: Secondary | ICD-10-CM | POA: Diagnosis not present

## 2022-01-21 DIAGNOSIS — I2510 Atherosclerotic heart disease of native coronary artery without angina pectoris: Secondary | ICD-10-CM | POA: Diagnosis not present

## 2022-01-21 DIAGNOSIS — H9319 Tinnitus, unspecified ear: Secondary | ICD-10-CM | POA: Diagnosis not present

## 2022-01-21 DIAGNOSIS — Z0000 Encounter for general adult medical examination without abnormal findings: Secondary | ICD-10-CM | POA: Diagnosis not present

## 2022-01-21 DIAGNOSIS — Z1212 Encounter for screening for malignant neoplasm of rectum: Secondary | ICD-10-CM | POA: Diagnosis not present

## 2022-01-25 ENCOUNTER — Telehealth: Payer: Self-pay

## 2022-01-25 NOTE — Telephone Encounter (Signed)
     Reason for call: ED-Follow up call   Patient visited [facility] on 12/23/2021 for Flank pain   Telephone encounter attempt :  1st Attempt  A HIPAA compliant voice message was left requesting a return call.  Instructed patient to call back at [phone] at their earliest convenience.   [place] management  [place], [place]  Main Phone: [phone]  E-mail: [email or web address]  Website: [email or web address]

## 2022-03-01 ENCOUNTER — Encounter: Payer: Self-pay | Admitting: Emergency Medicine

## 2022-03-01 ENCOUNTER — Ambulatory Visit: Payer: Medicare Other | Admitting: Emergency Medicine

## 2022-03-01 VITALS — BP 120/64 | HR 86 | Temp 98.4°F | Ht 68.0 in | Wt 196.4 lb

## 2022-03-01 DIAGNOSIS — R053 Chronic cough: Secondary | ICD-10-CM

## 2022-03-01 DIAGNOSIS — Z720 Tobacco use: Secondary | ICD-10-CM

## 2022-03-01 DIAGNOSIS — J449 Chronic obstructive pulmonary disease, unspecified: Secondary | ICD-10-CM

## 2022-03-01 DIAGNOSIS — R059 Cough, unspecified: Secondary | ICD-10-CM | POA: Insufficient documentation

## 2022-03-01 NOTE — Progress Notes (Signed)
Subjective:    Patient ID: Nathan Porter, male    DOB: 1955/06/10, 66 y.o.   MRN: [id number]  HPI 66 year old smoker (45 pack years, smokes 1 pack daily), with hypertension, hyperlipidemia, CAD based on coronary calcification on LDCT performed for lung cancer screening 10/2018.  That was a RADS 2 study, reassuring.  Referred today for pulmonary evaluation and restratification in preparation for right total hip replacement by Dr. French Ana.   He was told that he had COPD about 5 months ago, based on emphysematous changes on LDCT 10/2018. 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 Activity limited some by his R hip pain, some deconditioning. He has a cough, bothers him at night, does not wake him. Has heard wheeze before, usually when he needs to cough. He is able walk indefinitely without dyspnea, some SOB stairs. He can play golf but was SOB with the hills. On no BD. He has tried chantix, ineffective. Now on wellbutrin and has cut down to 15 cig daily.     ROV 09/13/20 --follow-up visit for 66 year old smoker (46 pack years) with hypertension, hyperlipidemia, CAD.  Follows up today for his history of tobacco, probable gold B COPD.  He has not had pulmonary function testing.  He was dx with RA about 4 months ago, just started on MTX beginning of the year. Since last time he had his right hip replacement. Still smoking about 1 pk a day.  He has had some spells of prolonged coughing, especially at night, but not happening now. Has some occasional cough during the day, not really productive. Not really limited by breathing. No wheeze.   Low-dose CT chest 12/21/2019 reviewed by me showed moderate centrilobular emphysema and some calcified and noncalcified pulmonary nodules that are unchanged, RADS 2 study.  ROV 03/01/22 --follow-up visit 66 year old man, active smoker (47 pack years).  He has a history of hypertension, hyperlipidemia, CAD, rheumatoid arthritis on methotrexate.  We  follow him for COPD.  He had COVID-19 about 1 month ago and his coughing has increased.  He is on all scheduled bronchodilators.  He does have albuterol available to use Most recent lung cancer screening CT was October 2021.  He is planning for a CT cardiac scoring CT later this month. He is still having increased cough, occasionally productive. He is having a lot of nasal congestion and drainage, on OTC nasacort daily.    Review of Systems  Constitutional:  Negative for activity change, appetite change, chills, diaphoresis, fatigue, fever and unexpected weight change.  HENT:  Negative for congestion, dental problem, nosebleeds, postnasal drip, rhinorrhea, sinus pressure, sneezing, trouble swallowing and voice change.   Eyes:  Negative for itching and visual disturbance.  Respiratory:  Positive for cough and shortness of breath. Negative for choking, chest tightness, wheezing and stridor.   Cardiovascular:  Negative for chest pain, palpitations and leg swelling.  Gastrointestinal:  Negative for abdominal pain.  Musculoskeletal:  Positive for gait problem and joint swelling. Negative for myalgias.  Skin:  Negative for rash.  Neurological:  Negative for syncope, light-headedness and headaches.  Psychiatric/Behavioral:  Negative for sleep disturbance.        Objective:   Physical Exam  Vitals:   03/01/22 0955  BP: 120/64  Pulse: 86  Temp: 98.4 F (36.9 C)  TempSrc: Oral  SpO2: 93%  Weight: 196 lb 6.4 oz (89.1 kg)  Height: '5\' 8"'$  (1.727 m)   Gen: Pleasant, overwt man, in no distress,  normal affect  ENT: No lesions,  mouth clear,  oropharynx clear, no postnasal drip  Neck: No JVD, no stridor  Lungs: No use of accessory muscles, no crackles or wheezing on normal respiration, he does wheeze on forced expiration  Cardiovascular: RRR, heart sounds normal, no murmur or gallops, no peripheral edema  Musculoskeletal: No deformities, no cyanosis or clubbing  Neuro: alert, awake, non  focal  Skin: Warm, no lesions or rash     Assessment & Plan:  COPD (chronic obstructive pulmonary disease) (HCC) We will arrange for pulmonary function testing in next office visit. Keep albuterol available to use 2 puffs up to every 4 hours if needed for shortness of breath, chest tightness, wheezing.  Follow with Dr. Lamonte Sakai next available with full PFT on the same day  Tobacco abuse Work on decreasing your cigarettes.  Try to get down to 15 cigarettes daily by our next visit. Get your cardiac screening CT scan as planned We can talk about the timing of lung cancer screening CT chest at your next visit  Cough Multifactorial due to his COPD and continued tobacco use, chronic rhinitis, his recent episode of COVID-19.  Will work on ramping up his rhinitis therapy at least temporarily.  Continue your Nasacort 2 sprays each nostril once daily. Add either loratadine 10 mg or Benadryl 25 mg daily to your routine Try doing nasal saline rinses several times a week to help clear nasal congestion.  This should help your cough.  Baltazar Apo, MD, PhD 03/01/2022, 10:35 AM [facility] [phone] or if no answer [phone]

## 2022-03-01 NOTE — Patient Instructions (Addendum)
We will arrange for pulmonary function testing in next office visit. Keep albuterol available to use 2 puffs up to every 4 hours if needed for shortness of breath, chest tightness, wheezing.  Continue your Nasacort 2 sprays each nostril once daily. Add either loratadine 10 mg or Benadryl 25 mg daily to your routine Try doing nasal saline rinses several times a week to help clear nasal congestion.  This should help your cough. Work on decreasing your cigarettes.  Try to get down to 15 cigarettes daily by our next visit. Get your cardiac screening CT scan as planned We can talk about the timing of lung cancer screening CT chest at your next visit Follow with Dr. Lamonte Sakai next available with full PFT on the same day

## 2022-03-01 NOTE — Assessment & Plan Note (Signed)
Multifactorial due to his COPD and continued tobacco use, chronic rhinitis, his recent episode of COVID-19.  Will work on ramping up his rhinitis therapy at least temporarily.  Continue your Nasacort 2 sprays each nostril once daily. Add either loratadine 10 mg or Benadryl 25 mg daily to your routine Try doing nasal saline rinses several times a week to help clear nasal congestion.  This should help your cough.

## 2022-03-01 NOTE — Assessment & Plan Note (Signed)
We will arrange for pulmonary function testing in next office visit. Keep albuterol available to use 2 puffs up to every 4 hours if needed for shortness of breath, chest tightness, wheezing.  Follow with Dr. Lamonte Sakai next available with full PFT on the same day

## 2022-03-01 NOTE — Assessment & Plan Note (Signed)
Work on decreasing your cigarettes.  Try to get down to 15 cigarettes daily by our next visit. Get your cardiac screening CT scan as planned We can talk about the timing of lung cancer screening CT chest at your next visit

## 2022-03-01 NOTE — Addendum Note (Signed)
Addended by: Monna Fam L on: 03/01/2022 10:39 AM   Modules accepted: Orders

## 2022-03-08 DIAGNOSIS — M1991 Primary osteoarthritis, unspecified site: Secondary | ICD-10-CM | POA: Diagnosis not present

## 2022-03-08 DIAGNOSIS — M25552 Pain in left hip: Secondary | ICD-10-CM | POA: Diagnosis not present

## 2022-03-08 DIAGNOSIS — M0579 Rheumatoid arthritis with rheumatoid factor of multiple sites without organ or systems involvement: Secondary | ICD-10-CM | POA: Diagnosis not present

## 2022-03-08 DIAGNOSIS — R5383 Other fatigue: Secondary | ICD-10-CM | POA: Diagnosis not present

## 2022-03-13 ENCOUNTER — Ambulatory Visit
Admission: RE | Admit: 2022-03-13 | Discharge: 2022-03-13 | Disposition: A | Discharge status: Home / Self Care | Payer: No Typology Code available for payment source | Source: Ambulatory Visit | Attending: Internal Medicine | Admitting: Internal Medicine

## 2022-03-13 DIAGNOSIS — E785 Hyperlipidemia, unspecified: Secondary | ICD-10-CM

## 2022-04-29 DIAGNOSIS — N200 Calculus of kidney: Secondary | ICD-10-CM | POA: Diagnosis not present

## 2022-04-29 DIAGNOSIS — N401 Enlarged prostate with lower urinary tract symptoms: Secondary | ICD-10-CM | POA: Diagnosis not present

## 2022-04-29 DIAGNOSIS — N201 Calculus of ureter: Secondary | ICD-10-CM | POA: Diagnosis not present

## 2022-04-29 DIAGNOSIS — R972 Elevated prostate specific antigen [PSA]: Secondary | ICD-10-CM | POA: Diagnosis not present

## 2022-04-29 DIAGNOSIS — N138 Other obstructive and reflux uropathy: Secondary | ICD-10-CM | POA: Diagnosis not present

## 2022-04-29 DIAGNOSIS — Z87442 Personal history of urinary calculi: Secondary | ICD-10-CM | POA: Diagnosis not present

## 2022-05-04 DIAGNOSIS — Z87442 Personal history of urinary calculi: Secondary | ICD-10-CM | POA: Diagnosis not present

## 2022-05-04 DIAGNOSIS — N200 Calculus of kidney: Secondary | ICD-10-CM | POA: Diagnosis not present

## 2022-05-06 DIAGNOSIS — N200 Calculus of kidney: Secondary | ICD-10-CM | POA: Diagnosis not present

## 2022-05-27 DIAGNOSIS — K080 Exfoliation of teeth due to systemic causes: Secondary | ICD-10-CM | POA: Diagnosis not present

## 2022-06-07 DIAGNOSIS — M0579 Rheumatoid arthritis with rheumatoid factor of multiple sites without organ or systems involvement: Secondary | ICD-10-CM | POA: Diagnosis not present

## 2022-07-01 DIAGNOSIS — K080 Exfoliation of teeth due to systemic causes: Secondary | ICD-10-CM | POA: Diagnosis not present

## 2022-07-18 DIAGNOSIS — L820 Inflamed seborrheic keratosis: Secondary | ICD-10-CM | POA: Diagnosis not present

## 2022-07-18 DIAGNOSIS — D0462 Carcinoma in situ of skin of left upper limb, including shoulder: Secondary | ICD-10-CM | POA: Diagnosis not present

## 2022-07-18 DIAGNOSIS — L565 Disseminated superficial actinic porokeratosis (DSAP): Secondary | ICD-10-CM | POA: Diagnosis not present

## 2022-07-18 DIAGNOSIS — D692 Other nonthrombocytopenic purpura: Secondary | ICD-10-CM | POA: Diagnosis not present

## 2022-07-18 DIAGNOSIS — L814 Other melanin hyperpigmentation: Secondary | ICD-10-CM | POA: Diagnosis not present

## 2022-07-18 DIAGNOSIS — L821 Other seborrheic keratosis: Secondary | ICD-10-CM | POA: Diagnosis not present

## 2022-07-18 DIAGNOSIS — D485 Neoplasm of uncertain behavior of skin: Secondary | ICD-10-CM | POA: Diagnosis not present

## 2022-07-30 ENCOUNTER — Encounter: Payer: Self-pay | Admitting: Emergency Medicine

## 2022-07-30 ENCOUNTER — Ambulatory Visit (INDEPENDENT_AMBULATORY_CARE_PROVIDER_SITE_OTHER): Payer: No Typology Code available for payment source | Admitting: Emergency Medicine

## 2022-07-30 ENCOUNTER — Ambulatory Visit (INDEPENDENT_AMBULATORY_CARE_PROVIDER_SITE_OTHER): Payer: Medicare Other | Admitting: Emergency Medicine

## 2022-07-30 VITALS — BP 126/84 | HR 66 | Ht 68.0 in | Wt 195.8 lb

## 2022-07-30 DIAGNOSIS — Z720 Tobacco use: Secondary | ICD-10-CM

## 2022-07-30 DIAGNOSIS — J449 Chronic obstructive pulmonary disease, unspecified: Secondary | ICD-10-CM | POA: Diagnosis not present

## 2022-07-30 LAB — PULMONARY FUNCTION TEST
DL/VA % pred: 102 %
DL/VA: 4.21 ml/min/mmHg/L
DLCO cor % pred: 97 %
DLCO cor: 23.95 ml/min/mmHg
DLCO unc % pred: 97 %
DLCO unc: 23.95 ml/min/mmHg
FEF 25-75 Post: 2.26 L/sec
FEF 25-75 Pre: 2.25 L/sec
FEF2575-%Change-Post: 0 %
FEF2575-%Pred-Post: 94 %
FEF2575-%Pred-Pre: 94 %
FEV1-%Change-Post: 1 %
FEV1-%Pred-Post: 96 %
FEV1-%Pred-Pre: 95 %
FEV1-Post: 2.97 L
FEV1-Pre: 2.94 L
FEV1FVC-%Change-Post: 2 %
FEV1FVC-%Pred-Pre: 99 %
FEV6-%Change-Post: 0 %
FEV6-%Pred-Post: 98 %
FEV6-%Pred-Pre: 99 %
FEV6-Post: 3.89 L
FEV6-Pre: 3.9 L
FEV6FVC-%Change-Post: 1 %
FEV6FVC-%Pred-Post: 105 %
FEV6FVC-%Pred-Pre: 103 %
FVC-%Change-Post: -1 %
FVC-%Pred-Post: 94 %
FVC-%Pred-Pre: 95 %
FVC-Post: 3.93 L
FVC-Pre: 3.98 L
Post FEV1/FVC ratio: 76 %
Post FEV6/FVC ratio: 99 %
Pre FEV1/FVC ratio: 74 %
Pre FEV6/FVC Ratio: 98 %
RV % pred: 107 %
RV: 2.47 L
TLC % pred: 98 %
TLC: 6.51 L

## 2022-07-30 NOTE — Assessment & Plan Note (Signed)
Congratulations on decreasing your cigarettes.  Continue to work on this.  Our goal is to get down to 10 cigarettes daily by your next visit. We will repeat your lung cancer screening CT chest in October 2024

## 2022-07-30 NOTE — Progress Notes (Signed)
Full PFT performed today. °

## 2022-07-30 NOTE — Progress Notes (Signed)
Subjective:    Patient ID: Nathan Porter, male    DOB: 1955/10/14, 67 y.o.   MRN: [id number]  HPI  ROV 03/01/22 --follow-up visit 67 year old man, active smoker (47 pack years).  He has a history of hypertension, hyperlipidemia, CAD, rheumatoid arthritis on methotrexate.  We follow him for COPD.  He had COVID-19 about 1 month ago and his coughing has increased.  He is on all scheduled bronchodilators.  He does have albuterol available to use Most recent lung cancer screening CT was October 2021.  He is planning for a CT cardiac scoring CT later this month. He is still having increased cough, occasionally productive. He is having a lot of nasal congestion and drainage, on OTC nasacort daily.    ROV 07/30/2022 --Nathan Porter is 68, active smoker (48 pack years), with hypertension, hyperlipidemia, CAD, RA on methotrexate and COPD, chronic rhinitis with some associated cough.  He had COVID-19 in late 2023. He denies any dyspnea at rest. He is fairly active, has had some trouble w hills. Never needs albuterol.   Pulmonary function testing performed today and reviewed by me shows grossly normal airflows without a bronchodilator response, normal lung volumes, normal diffusion capacity.  The flow-volume loop is normal in appearance.  Cardiac calcium scoring CT chest 03/13/2022 reviewed by me showed emphysematous change, no consolidation, no nodules.   Review of Systems  Constitutional:  Negative for activity change, appetite change, chills, diaphoresis, fatigue, fever and unexpected weight change.  HENT:  Negative for congestion, dental problem, nosebleeds, postnasal drip, rhinorrhea, sinus pressure, sneezing, trouble swallowing and voice change.   Eyes:  Negative for itching and visual disturbance.  Respiratory:  Positive for cough and shortness of breath. Negative for choking, chest tightness, wheezing and stridor.   Cardiovascular:  Negative for chest pain, palpitations and leg swelling.   Gastrointestinal:  Negative for abdominal pain.  Musculoskeletal:  Positive for gait problem and joint swelling. Negative for myalgias.  Skin:  Negative for rash.  Neurological:  Negative for syncope, light-headedness and headaches.  Psychiatric/Behavioral:  Negative for sleep disturbance.        Objective:   Physical Exam  Vitals:   07/30/22 0952  BP: 126/84  Pulse: 66  SpO2: 97%  Weight: 195 lb 12.8 oz (88.8 kg)  Height: 5\' 8"  (1.727 m)   Gen: Pleasant, overwt man, in no distress,  normal affect  ENT: No lesions,  mouth clear,  oropharynx clear, no postnasal drip  Neck: No JVD, no stridor  Lungs: No use of accessory muscles, no crackles or wheezing on normal respiration, he does wheeze on forced expiration  Cardiovascular: RRR, heart sounds normal, no murmur or gallops, no peripheral edema  Musculoskeletal: No deformities, no cyanosis or clubbing  Neuro: alert, awake, non focal  Skin: Warm, no lesions or rash     Assessment & Plan:  COPD (chronic obstructive pulmonary disease) (HCC) COPD stage A, minimal symptoms although he does have some exertional shortness of breath with hills, stairs.  No albuterol use.  His pulmonary function testing is reassuring.  We talked today about the importance of smoking cessation which should be our primary focus at this time.  He may need to have scheduled BD added at some point going forward.  Tobacco abuse Congratulations on decreasing your cigarettes.  Continue to work on this.  Our goal is to get down to 10 cigarettes daily by your next visit. We will repeat your lung cancer screening CT chest in October 2024  Levy Pupa, MD, PhD 07/30/2022, 1:38 PM  Pulmonary and Critical Care [phone] or if no answer [phone]

## 2022-07-30 NOTE — Assessment & Plan Note (Signed)
COPD stage A, minimal symptoms although he does have some exertional shortness of breath with hills, stairs.  No albuterol use.  His pulmonary function testing is reassuring.  We talked today about the importance of smoking cessation which should be our primary focus at this time.  He may need to have scheduled BD added at some point going forward.

## 2022-07-30 NOTE — Patient Instructions (Signed)
We reviewed your pulmonary function testing today. Keep albuterol available to use 2 puffs to be needed for shortness of breath, chest tightness, wheezing. Congratulations on decreasing your cigarettes.  Continue to work on this.  Our goal is to get down to 10 cigarettes daily by your next visit. We will repeat your lung cancer screening CT chest in October 2024 Follow Dr. Delton Coombes in October after your CT so we can review those results and discuss your progress with the smoking.

## 2022-07-30 NOTE — Patient Instructions (Signed)
Full PFT performed today. °

## 2022-08-05 DIAGNOSIS — D0462 Carcinoma in situ of skin of left upper limb, including shoulder: Secondary | ICD-10-CM | POA: Diagnosis not present

## 2022-09-17 DIAGNOSIS — R5383 Other fatigue: Secondary | ICD-10-CM | POA: Diagnosis not present

## 2022-09-17 DIAGNOSIS — M1991 Primary osteoarthritis, unspecified site: Secondary | ICD-10-CM | POA: Diagnosis not present

## 2022-09-17 DIAGNOSIS — I700 Atherosclerosis of aorta: Secondary | ICD-10-CM | POA: Diagnosis not present

## 2022-09-17 DIAGNOSIS — E785 Hyperlipidemia, unspecified: Secondary | ICD-10-CM | POA: Diagnosis not present

## 2022-09-17 DIAGNOSIS — M25552 Pain in left hip: Secondary | ICD-10-CM | POA: Diagnosis not present

## 2022-09-17 DIAGNOSIS — J449 Chronic obstructive pulmonary disease, unspecified: Secondary | ICD-10-CM | POA: Diagnosis not present

## 2022-09-17 DIAGNOSIS — M0579 Rheumatoid arthritis with rheumatoid factor of multiple sites without organ or systems involvement: Secondary | ICD-10-CM | POA: Diagnosis not present

## 2022-10-15 ENCOUNTER — Other Ambulatory Visit: Payer: Self-pay | Admitting: Cardiology

## 2022-10-15 DIAGNOSIS — I10 Essential (primary) hypertension: Secondary | ICD-10-CM

## 2022-12-17 DIAGNOSIS — M0579 Rheumatoid arthritis with rheumatoid factor of multiple sites without organ or systems involvement: Secondary | ICD-10-CM | POA: Diagnosis not present

## 2022-12-19 DIAGNOSIS — K080 Exfoliation of teeth due to systemic causes: Secondary | ICD-10-CM | POA: Diagnosis not present

## 2022-12-24 ENCOUNTER — Other Ambulatory Visit: Payer: Self-pay | Admitting: Cardiology

## 2022-12-24 DIAGNOSIS — I10 Essential (primary) hypertension: Secondary | ICD-10-CM

## 2023-01-02 ENCOUNTER — Other Ambulatory Visit: Payer: Medicare Other

## 2023-01-20 DIAGNOSIS — R7989 Other specified abnormal findings of blood chemistry: Secondary | ICD-10-CM | POA: Diagnosis not present

## 2023-01-20 DIAGNOSIS — M0579 Rheumatoid arthritis with rheumatoid factor of multiple sites without organ or systems involvement: Secondary | ICD-10-CM | POA: Diagnosis not present

## 2023-01-24 ENCOUNTER — Ambulatory Visit
Admission: RE | Admit: 2023-01-24 | Discharge: 2023-01-24 | Disposition: A | Discharge status: Home / Self Care | Payer: Medicare Other | Source: Ambulatory Visit | Attending: Emergency Medicine | Admitting: Emergency Medicine

## 2023-01-24 DIAGNOSIS — M1991 Primary osteoarthritis, unspecified site: Secondary | ICD-10-CM | POA: Diagnosis not present

## 2023-01-24 DIAGNOSIS — F17210 Nicotine dependence, cigarettes, uncomplicated: Secondary | ICD-10-CM | POA: Diagnosis not present

## 2023-01-24 DIAGNOSIS — M0579 Rheumatoid arthritis with rheumatoid factor of multiple sites without organ or systems involvement: Secondary | ICD-10-CM | POA: Diagnosis not present

## 2023-01-24 DIAGNOSIS — Z720 Tobacco use: Secondary | ICD-10-CM

## 2023-01-24 DIAGNOSIS — R5383 Other fatigue: Secondary | ICD-10-CM | POA: Diagnosis not present

## 2023-01-24 DIAGNOSIS — M25552 Pain in left hip: Secondary | ICD-10-CM | POA: Diagnosis not present

## 2023-01-31 ENCOUNTER — Other Ambulatory Visit: Payer: Self-pay | Admitting: Cardiology

## 2023-01-31 DIAGNOSIS — I10 Essential (primary) hypertension: Secondary | ICD-10-CM

## 2023-02-10 DIAGNOSIS — Z111 Encounter for screening for respiratory tuberculosis: Secondary | ICD-10-CM | POA: Diagnosis not present

## 2023-02-10 DIAGNOSIS — R5383 Other fatigue: Secondary | ICD-10-CM | POA: Diagnosis not present

## 2023-02-10 DIAGNOSIS — R7989 Other specified abnormal findings of blood chemistry: Secondary | ICD-10-CM | POA: Diagnosis not present

## 2023-02-18 DIAGNOSIS — M25552 Pain in left hip: Secondary | ICD-10-CM | POA: Diagnosis not present

## 2023-02-18 DIAGNOSIS — R5383 Other fatigue: Secondary | ICD-10-CM | POA: Diagnosis not present

## 2023-02-18 DIAGNOSIS — M0579 Rheumatoid arthritis with rheumatoid factor of multiple sites without organ or systems involvement: Secondary | ICD-10-CM | POA: Diagnosis not present

## 2023-02-18 DIAGNOSIS — M1991 Primary osteoarthritis, unspecified site: Secondary | ICD-10-CM | POA: Diagnosis not present

## 2023-02-28 ENCOUNTER — Other Ambulatory Visit: Payer: Self-pay | Admitting: Cardiology

## 2023-02-28 DIAGNOSIS — I10 Essential (primary) hypertension: Secondary | ICD-10-CM

## 2023-03-07 ENCOUNTER — Other Ambulatory Visit: Payer: Self-pay | Admitting: Cardiology

## 2023-03-07 DIAGNOSIS — I10 Essential (primary) hypertension: Secondary | ICD-10-CM

## 2023-04-04 DIAGNOSIS — E785 Hyperlipidemia, unspecified: Secondary | ICD-10-CM | POA: Diagnosis not present

## 2023-04-04 DIAGNOSIS — Z125 Encounter for screening for malignant neoplasm of prostate: Secondary | ICD-10-CM | POA: Diagnosis not present

## 2023-04-04 DIAGNOSIS — Z1212 Encounter for screening for malignant neoplasm of rectum: Secondary | ICD-10-CM | POA: Diagnosis not present

## 2023-04-11 DIAGNOSIS — Z0000 Encounter for general adult medical examination without abnormal findings: Secondary | ICD-10-CM | POA: Diagnosis not present

## 2023-04-11 DIAGNOSIS — Z23 Encounter for immunization: Secondary | ICD-10-CM | POA: Diagnosis not present

## 2023-04-11 DIAGNOSIS — M069 Rheumatoid arthritis, unspecified: Secondary | ICD-10-CM | POA: Diagnosis not present

## 2023-04-11 DIAGNOSIS — R82998 Other abnormal findings in urine: Secondary | ICD-10-CM | POA: Diagnosis not present

## 2023-05-26 DIAGNOSIS — M25552 Pain in left hip: Secondary | ICD-10-CM | POA: Diagnosis not present

## 2023-05-26 DIAGNOSIS — M1991 Primary osteoarthritis, unspecified site: Secondary | ICD-10-CM | POA: Diagnosis not present

## 2023-05-26 DIAGNOSIS — M0579 Rheumatoid arthritis with rheumatoid factor of multiple sites without organ or systems involvement: Secondary | ICD-10-CM | POA: Diagnosis not present

## 2023-05-26 DIAGNOSIS — R5383 Other fatigue: Secondary | ICD-10-CM | POA: Diagnosis not present

## 2023-06-19 DIAGNOSIS — H903 Sensorineural hearing loss, bilateral: Secondary | ICD-10-CM | POA: Diagnosis not present

## 2023-07-21 DIAGNOSIS — D0472 Carcinoma in situ of skin of left lower limb, including hip: Secondary | ICD-10-CM | POA: Diagnosis not present

## 2023-07-21 DIAGNOSIS — Z85828 Personal history of other malignant neoplasm of skin: Secondary | ICD-10-CM | POA: Diagnosis not present

## 2023-07-21 DIAGNOSIS — L821 Other seborrheic keratosis: Secondary | ICD-10-CM | POA: Diagnosis not present

## 2023-07-21 DIAGNOSIS — L570 Actinic keratosis: Secondary | ICD-10-CM | POA: Diagnosis not present

## 2023-07-21 DIAGNOSIS — D0439 Carcinoma in situ of skin of other parts of face: Secondary | ICD-10-CM | POA: Diagnosis not present

## 2023-07-21 DIAGNOSIS — D225 Melanocytic nevi of trunk: Secondary | ICD-10-CM | POA: Diagnosis not present

## 2023-07-28 DIAGNOSIS — K080 Exfoliation of teeth due to systemic causes: Secondary | ICD-10-CM | POA: Diagnosis not present

## 2023-08-19 DIAGNOSIS — C44329 Squamous cell carcinoma of skin of other parts of face: Secondary | ICD-10-CM | POA: Diagnosis not present

## 2023-09-16 DIAGNOSIS — L820 Inflamed seborrheic keratosis: Secondary | ICD-10-CM | POA: Diagnosis not present

## 2023-10-03 DIAGNOSIS — R5383 Other fatigue: Secondary | ICD-10-CM | POA: Diagnosis not present

## 2023-10-03 DIAGNOSIS — M0579 Rheumatoid arthritis with rheumatoid factor of multiple sites without organ or systems involvement: Secondary | ICD-10-CM | POA: Diagnosis not present

## 2023-10-03 DIAGNOSIS — M1991 Primary osteoarthritis, unspecified site: Secondary | ICD-10-CM | POA: Diagnosis not present

## 2023-10-03 DIAGNOSIS — M25552 Pain in left hip: Secondary | ICD-10-CM | POA: Diagnosis not present

## 2023-10-04 ENCOUNTER — Emergency Department (HOSPITAL_BASED_OUTPATIENT_CLINIC_OR_DEPARTMENT_OTHER)

## 2023-10-04 ENCOUNTER — Other Ambulatory Visit: Payer: Self-pay

## 2023-10-04 ENCOUNTER — Emergency Department (HOSPITAL_BASED_OUTPATIENT_CLINIC_OR_DEPARTMENT_OTHER)
Admission: EM | Admit: 2023-10-04 | Discharge: 2023-10-04 | Disposition: A | Discharge status: Home / Self Care | Attending: Emergency Medicine | Admitting: Emergency Medicine

## 2023-10-04 ENCOUNTER — Encounter (HOSPITAL_BASED_OUTPATIENT_CLINIC_OR_DEPARTMENT_OTHER): Payer: Self-pay

## 2023-10-04 DIAGNOSIS — Z7982 Long term (current) use of aspirin: Secondary | ICD-10-CM | POA: Diagnosis not present

## 2023-10-04 DIAGNOSIS — R82998 Other abnormal findings in urine: Secondary | ICD-10-CM

## 2023-10-04 DIAGNOSIS — N132 Hydronephrosis with renal and ureteral calculous obstruction: Secondary | ICD-10-CM | POA: Diagnosis not present

## 2023-10-04 DIAGNOSIS — K8020 Calculus of gallbladder without cholecystitis without obstruction: Secondary | ICD-10-CM | POA: Insufficient documentation

## 2023-10-04 DIAGNOSIS — N201 Calculus of ureter: Secondary | ICD-10-CM

## 2023-10-04 DIAGNOSIS — Z79899 Other long term (current) drug therapy: Secondary | ICD-10-CM | POA: Insufficient documentation

## 2023-10-04 DIAGNOSIS — R109 Unspecified abdominal pain: Secondary | ICD-10-CM

## 2023-10-04 DIAGNOSIS — I700 Atherosclerosis of aorta: Secondary | ICD-10-CM | POA: Diagnosis not present

## 2023-10-04 DIAGNOSIS — K5730 Diverticulosis of large intestine without perforation or abscess without bleeding: Secondary | ICD-10-CM | POA: Diagnosis not present

## 2023-10-04 DIAGNOSIS — R1031 Right lower quadrant pain: Secondary | ICD-10-CM | POA: Diagnosis present

## 2023-10-04 LAB — COMPREHENSIVE METABOLIC PANEL WITH GFR
ALT: 30 U/L (ref 0–44)
AST: 27 U/L (ref 15–41)
Albumin: 4 g/dL (ref 3.5–5.0)
Alkaline Phosphatase: 73 U/L (ref 38–126)
Anion gap: 12 (ref 5–15)
BUN: 25 mg/dL — ABNORMAL HIGH (ref 8–23)
CO2: 21 mmol/L — ABNORMAL LOW (ref 22–32)
Calcium: 9.1 mg/dL (ref 8.9–10.3)
Chloride: 103 mmol/L (ref 98–111)
Creatinine, Ser: 1.12 mg/dL (ref 0.61–1.24)
GFR, Estimated: >60 mL/min (ref >60)
Glucose, Bld: 111 mg/dL — ABNORMAL HIGH (ref 70–99)
Potassium: 4.1 mmol/L (ref 3.5–5.1)
Sodium: 137 mmol/L (ref 135–145)
Total Bilirubin: 0.3 mg/dL (ref 0.0–1.2)
Total Protein: 6.8 g/dL (ref 6.5–8.1)

## 2023-10-04 LAB — URINALYSIS, ROUTINE W REFLEX MICROSCOPIC
Bilirubin Urine: NEGATIVE
Glucose, UA: NEGATIVE mg/dL
Ketones, ur: NEGATIVE mg/dL
Nitrite: NEGATIVE
Protein, ur: 30 mg/dL — AB
RBC / HPF: >50 RBC/hpf (ref 0–5)
Specific Gravity, Urine: 1.022 (ref 1.005–1.030)
WBC, UA: >50 WBC/hpf (ref 0–5)
pH: 5.5 (ref 5.0–8.0)

## 2023-10-04 LAB — CBC
HCT: 43.1 % (ref 39.0–52.0)
Hemoglobin: 15.3 g/dL (ref 13.0–17.0)
MCH: 32.1 pg (ref 26.0–34.0)
MCHC: 35.5 g/dL (ref 30.0–36.0)
MCV: 90.4 fL (ref 80.0–100.0)
Platelets: 241 K/uL (ref 150–400)
RBC: 4.77 MIL/uL (ref 4.22–5.81)
RDW: 13.5 % (ref 11.5–15.5)
WBC: 10.5 K/uL (ref 4.0–10.5)
nRBC: 0 % (ref 0.0–0.2)

## 2023-10-04 LAB — LIPASE, BLOOD: Lipase: 25 U/L (ref 11–51)

## 2023-10-04 MED ORDER — OXYCODONE-ACETAMINOPHEN 5-325 MG PO TABS: 1.0000 | ORAL_TABLET | Freq: Four times a day (QID) | ORAL | 0 refills | Status: AC | PRN

## 2023-10-04 MED ORDER — MORPHINE SULFATE (PF) 4 MG/ML IV SOLN
4.0000 mg | Freq: Once | INTRAVENOUS | Status: DC
  Filled 2023-10-04: qty 1

## 2023-10-04 MED ORDER — ONDANSETRON 8 MG PO TBDP: 8.0000 mg | ORAL_TABLET | Freq: Three times a day (TID) | ORAL | 0 refills | Status: AC | PRN

## 2023-10-04 MED ORDER — ONDANSETRON HCL 4 MG/2ML IJ SOLN
4.0000 mg | Freq: Once | INTRAMUSCULAR | Status: AC
  Administered 2023-10-04: 4 mg via INTRAVENOUS
  Filled 2023-10-04: qty 2

## 2023-10-04 MED ORDER — SODIUM CHLORIDE 0.9 % IV SOLN
2.0000 g | Freq: Once | INTRAVENOUS | Status: AC
  Administered 2023-10-04: 2 g via INTRAVENOUS
  Filled 2023-10-04: qty 20

## 2023-10-04 MED ORDER — CEFDINIR 300 MG PO CAPS: 300.0000 mg | ORAL_CAPSULE | Freq: Two times a day (BID) | ORAL | 0 refills | Status: AC

## 2023-10-04 NOTE — ED Notes (Signed)
 ED Provider at bedside.

## 2023-10-04 NOTE — ED Provider Notes (Signed)
 [place] EMERGENCY DEPARTMENT AT [facility] Provider Note   CSN: [id number] Arrival date & time: 10/04/23  1435     Patient presents with: Abdominal Pain and Back Pain   Nathan Porter is a 68 y.o. male.   Pt with c/o right flank, right lower abd, and bilateral low back pain in past 2-3 hours. Symptoms present at rest, constant, dull, non radiating. Hx kidney stones, unsure if same. No dysuria. No scrotal or testicular pain. No nausea/vomiting. Having normal bms. No fever or chills.   The history is provided by the patient and medical records.  Abdominal Pain Associated symptoms: no chest pain, no chills, no cough, no diarrhea, no dysuria, no fever, no shortness of breath and no vomiting   Back Pain Associated symptoms: abdominal pain   Associated symptoms: no chest pain, no dysuria, no fever and no headaches        Prior to Admission medications   Medication Sig Start Date End Date Taking? Authorizing Provider  albuterol  (VENTOLIN  HFA) 108 (90 Base) MCG/ACT inhaler Inhale 2 puffs into the lungs every 6 (six) hours as needed for wheezing or shortness of breath. 09/13/20   Byrum, Lamar RAMAN, MD  amLODipine  (NORVASC ) 5 MG tablet TAKE 1 TABLET BY MOUTH EVERY DAY 01/31/23   Ladona Heinz, MD  aspirin  EC 81 MG tablet Take 1 tablet (81 mg total) by mouth daily. 11/14/21   Ladona Heinz, MD  ezetimibe (ZETIA) 10 MG tablet Take 10 mg by mouth daily. 03/19/22   [provider]  folic acid (FOLVITE) 1 MG tablet Take 1 mg by mouth in the morning and at bedtime. 09/01/20   [provider]  HYDROcodone -acetaminophen  (NORCO/VICODIN) 5-325 MG tablet Take 1 tab po q4-6hrs prn pain, may need 1-2 first couple weeks on occasion, max 8 tabs per day. 08/13/19   Chadwell, Joshua, PA-C  losartan (COZAAR) 25 MG tablet Take 25 mg by mouth at bedtime. 03/26/21   [provider]  methotrexate 2.5 MG tablet Take 15 mg by mouth once a week. 09/04/20   [provider]   ondansetron  (ZOFRAN -ODT) 4 MG disintegrating tablet Take 1 tablet (4 mg total) by mouth every 8 (eight) hours as needed. 12/23/21   Long, Fonda MATSU, MD  oxyCODONE -acetaminophen  (PERCOCET/ROXICET) 5-325 MG tablet Take 1 tablet by mouth every 6 (six) hours as needed for severe pain. 12/23/21   Long, Joshua G, MD  polycarbophil (FIBERCON) 625 MG tablet Take 1,250 mg by mouth daily.    [provider]  promethazine  (PHENERGAN ) 25 MG tablet Take 25 mg by mouth 2 (two) times daily as needed (nausea associated with kidney stones.).  06/28/18   [provider]  rosuvastatin  (CRESTOR ) 20 MG tablet Take 20 mg by mouth at bedtime. 04/13/19   [provider]  tamsulosin  (FLOMAX ) 0.4 MG CAPS capsule Take 1 capsule (0.4 mg total) by mouth daily. 12/23/21   Long, Fonda MATSU, MD  traMADol  (ULTRAM ) 50 MG tablet Take 50 mg by mouth 2 (two) times daily as needed (pain associated with kidney stones.).  06/11/18   [provider]  triamcinolone (NASACORT) 55 MCG/ACT AERO nasal inhaler Place 2 sprays into the nose daily.    [provider]    Allergies: Patient has no known allergies.    Review of Systems  Constitutional:  Negative for chills and fever.  Respiratory:  Negative for cough and shortness of breath.   Cardiovascular:  Negative for chest pain.  Gastrointestinal:  Positive for abdominal  pain. Negative for diarrhea and vomiting.  Genitourinary:  Negative for dysuria.  Musculoskeletal:  Positive for back pain.  Skin:  Negative for rash.  Neurological:  Negative for headaches.    Updated Vital Signs BP 132/68   Pulse 71   Temp 97.8 F (36.6 C) (Oral)   Resp 16   Ht 1.727 m (5' 8)   Wt 88.8 kg   SpO2 95%   BMI 29.77 kg/m   Physical Exam Vitals and nursing note reviewed.  Constitutional:      Appearance: Normal appearance. He is well-developed.  HENT:     Head: Atraumatic.     Mouth/Throat:     Mouth: Mucous membranes are moist.  Eyes:     General: No  scleral icterus.    Conjunctiva/sclera: Conjunctivae normal.  Neck:     Trachea: No tracheal deviation.  Cardiovascular:     Rate and Rhythm: Normal rate and regular rhythm.     Pulses: Normal pulses.     Heart sounds: Normal heart sounds. No murmur heard.    No friction rub. No gallop.  Pulmonary:     Effort: Pulmonary effort is normal. No accessory muscle usage or respiratory distress.     Breath sounds: Normal breath sounds.  Abdominal:     General: Bowel sounds are normal. There is no distension.     Palpations: Abdomen is soft. There is no mass.     Tenderness: There is no abdominal tenderness. There is no guarding or rebound.     Hernia: No hernia is present.  Genitourinary:    Comments: No cva tenderness. No testicular pain/swelling/tenderness.  Musculoskeletal:        General: No swelling.     Cervical back: Neck supple.     Comments: T/L/S spine non tender, aligned. No back sts or skin changes. No focal tenderness.   Skin:    General: Skin is warm and dry.     Findings: No rash.  Neurological:     Mental Status: He is alert.     Comments: Alert, speech clear. Motor/sens grossly intact. Steady gait.   Psychiatric:        Mood and Affect: Mood normal.     (all labs ordered are listed, but only abnormal results are displayed) Results for orders placed or performed during the hospital encounter of 10/04/23  Lipase, blood   Collection Time: 10/04/23  3:00 PM  Result Value Ref Range   Lipase 25 11 - 51 U/L  Comprehensive metabolic panel   Collection Time: 10/04/23  3:00 PM  Result Value Ref Range   Sodium 137 135 - 145 mmol/L   Potassium 4.1 3.5 - 5.1 mmol/L   Chloride 103 98 - 111 mmol/L   CO2 21 (L) 22 - 32 mmol/L   Glucose, Bld 111 (H) 70 - 99 mg/dL   BUN 25 (H) 8 - 23 mg/dL   Creatinine, Ser 8.87 0.61 - 1.24 mg/dL   Calcium  9.1 8.9 - 10.3 mg/dL   Total Protein 6.8 6.5 - 8.1 g/dL   Albumin  4.0 3.5 - 5.0 g/dL   AST 27 15 - 41 U/L   ALT 30 0 - 44 U/L    Alkaline Phosphatase 73 38 - 126 U/L   Total Bilirubin 0.3 0.0 - 1.2 mg/dL   GFR, Estimated >39 >39 mL/min   Anion gap 12 5 - 15  CBC   Collection Time: 10/04/23  3:00 PM  Result Value Ref Range   WBC 10.5 4.0 -  10.5 K/uL   RBC 4.77 4.22 - 5.81 MIL/uL   Hemoglobin 15.3 13.0 - 17.0 g/dL   HCT 56.8 60.9 - 47.9 %   MCV 90.4 80.0 - 100.0 fL   MCH 32.1 26.0 - 34.0 pg   MCHC 35.5 30.0 - 36.0 g/dL   RDW 86.4 88.4 - 84.4 %   Platelets 241 150 - 400 K/uL   nRBC 0.0 0.0 - 0.2 %  Urinalysis, Routine w reflex microscopic -Urine, Clean Catch   Collection Time: 10/04/23  3:00 PM  Result Value Ref Range   Color, Urine YELLOW YELLOW   APPearance HAZY (A) CLEAR   Specific Gravity, Urine 1.022 1.005 - 1.030   pH 5.5 5.0 - 8.0   Glucose, UA NEGATIVE NEGATIVE mg/dL   Hgb urine dipstick LARGE (A) NEGATIVE   Bilirubin Urine NEGATIVE NEGATIVE   Ketones, ur NEGATIVE NEGATIVE mg/dL   Protein, ur 30 (A) NEGATIVE mg/dL   Nitrite NEGATIVE NEGATIVE   Leukocytes,Ua MODERATE (A) NEGATIVE   RBC / HPF >50 0 - 5 RBC/hpf   WBC, UA >50 0 - 5 WBC/hpf   Bacteria, UA RARE (A) NONE SEEN   Squamous Epithelial / HPF 0-5 0 - 5 /HPF   Mucus PRESENT      EKG: None  Radiology: CT Renal Stone Study Result Date: 10/04/2023 CLINICAL DATA:  Abdominal/flank pain, stone suspected right flank pain EXAM: CT ABDOMEN AND PELVIS WITHOUT CONTRAST TECHNIQUE: Multidetector CT imaging of the abdomen and pelvis was performed following the standard protocol without IV contrast. RADIATION DOSE REDUCTION: This exam was performed according to the departmental dose-optimization program which includes automated exposure control, adjustment of the mA and/or kV according to patient size and/or use of iterative reconstruction technique. COMPARISON:  CT renal 12/23/2021 FINDINGS: Lower chest: No acute abnormality.  Coronary artery calcification. Hepatobiliary: No focal liver abnormality. Calcified gallstone noted within the gallbladder lumen.  No gallbladder wall thickening or pericholecystic fluid. No biliary dilatation. Pancreas: No focal lesion. Normal pancreatic contour. No surrounding inflammatory changes. No main pancreatic ductal dilatation. Spleen: Normal in size without focal abnormality. Adrenals/Urinary Tract: Stable left adrenal gland nodule measuring 2 cm with density of -9 Hounsfield units consistent with known adenoma-no further follow-up indicated. No adrenal gland nodule on the right. Multiple bulky bilateral nephrolithiasis measuring up to 2.2 cm on the left and 1.6 cm on the right. There is a 0.5 cm calcified stone at the right ureteropelvic junction with proximal hydronephrosis. Associated mild peri ureteral fat stranding. The ureter is normal caliber distally. No left ureterolithiasis. No left hydroureteronephrosis. The urinary bladder is unremarkable. Stomach/Bowel: Stomach is within normal limits. No evidence of bowel wall thickening or dilatation. Colonic diverticulosis. Appendix appears normal. Vascular/Lymphatic: No abdominal aorta or iliac aneurysm. Severe atherosclerotic plaque of the aorta and its branches. No abdominal, pelvic, or inguinal lymphadenopathy. Reproductive: Prostate is unremarkable. Other: No intraperitoneal free fluid. No intraperitoneal free gas. No organized fluid collection. Musculoskeletal: No abdominal wall hernia or abnormality. No suspicious lytic or blastic osseous lesions. No acute displaced fracture. Multilevel degenerative changes of the spine. Total right hip arthroplasty. IMPRESSION: 1. Obstructive 5 mm right ureteropelvic junction stone. Associated mild peri ureteral fat stranding. Correlate with urinalysis for superimposed infection. 2. Nonobstructive bilateral nephrolithiasis measuring up to 22 mm on the left and 16 mm on the right. 3. Cholelithiasis with no acute cholecystitis. 4. Colonic diverticulosis with no acute diverticulitis. 5.  Aortic Atherosclerosis (ICD10-I70.0). Electronically Signed    By: Morgane  Naveau M.D.  On: 10/04/2023 16:16     Procedures   Medications Ordered in the ED  morphine  (PF) 4 MG/ML injection 4 mg (4 mg Intravenous Patient Refused/Not Given 10/04/23 1547)  cefTRIAXone  (ROCEPHIN ) 2 g in sodium chloride  0.9 % 100 mL IVPB (has no administration in time range)  ondansetron  (ZOFRAN ) injection 4 mg (4 mg Intravenous Given 10/04/23 1547)                                    Medical Decision Making Problems Addressed: Right flank pain: acute illness or injury with systemic symptoms that poses a threat to life or bodily functions Right ureteral stone: acute illness or injury with systemic symptoms Urine white blood cells increased: acute illness or injury  Amount and/or Complexity of Data Reviewed External Data Reviewed: notes. Labs: ordered. Decision-making details documented in ED Course. Radiology: ordered and independent interpretation performed. Decision-making details documented in ED Course. Discussion of management or test interpretation with external provider(s): Urology, discussed pt.   Risk Prescription drug management. Decision regarding hospitalization.   Iv ns.  Labs ordered/sent. Imaging ordered.   Differential diagnosis includes ureteral stone, acute abd process, etc. Dispo decision including potential need for admission considered - will get labs and imaging and reassess.   Reviewed nursing notes and prior charts for additional history. External reports reviewed.   Morphine  iv. Zofran  iv.   Labs reviewed/interpreted by me - ua with many rbcs and wbcs. Nitrite neg. Culture sent. Iv abx given.   CT reviewed/interpreted by me - 5 mm right upj stone. +stranding.   Pt indicates pain resolved (prior to morphine  being given), and indicates does not need/want any pain meds. No cva tenderness. Denies fever/chills/sweats. No dysuria.   Urology consulted - discussed pt, ct, and UA with Dr Selma - he indicates if pain controlled, plan of d/c  to home w office f/u this coming week, po abx, return precautions.   Recheck pt, vitals normal, no fever, chills or sweats. Abd soft non tender. No cva tenderness. No pain.   Additional recheck, continues to have pain, no fever/chills. No nv. Vitals normal. Pt currently appears stable for ED d/c.   Rec close urology f/u Monday.  Return precautions provided.       Final diagnoses:  None    ED Discharge Orders     None          Bernard Drivers, MD 10/04/23 1735

## 2023-10-04 NOTE — Discharge Instructions (Addendum)
 It was our pleasure to provide your ER care today - we hope that you feel better.  You CT scan shows a 5 mm kidney stone in the right ureter at the right UPJ.   Drink plenty of fluids/stay well hydrated. Strain urine. Take antibiotic (cefdinir ) as prescribed. Take motrin or aleve as need for pain. You may also take percocet as need for pain. No driving for the next 6 hours or when taking percocet. Also, do not take tylenol  or acetaminophen  containing medication when taking percocet. You may take zofran  as need for nausea.   Follow up with urologist this Monday. Call office Monday AM to arrange appointment.   Return to [facility] (as [facility] our [facility]) right away if worse, new symptoms, fevers, worsening or severe or intractable pain, persistent vomiting, weak/fainting, or other concern.

## 2023-10-04 NOTE — ED Triage Notes (Signed)
 Arrives POV with complaints of abdominal pain, low back pain, and nausea that started today. Patient would like to rule out kidney stones since he has a history of the same. Rates pain a 4/10.

## 2023-10-04 NOTE — ED Notes (Signed)
Reviewed discharge instructions, medications, and home care with pt. Pt verbalized understanding and had no further questions. Pt exited ED without complications.

## 2023-10-06 LAB — URINE CULTURE: Culture: NO GROWTH

## 2024-02-17 ENCOUNTER — Other Ambulatory Visit: Payer: Self-pay | Admitting: Internal Medicine

## 2024-02-17 DIAGNOSIS — F17200 Nicotine dependence, unspecified, uncomplicated: Secondary | ICD-10-CM
